# Patient Record
Sex: Female | Born: 1935 | Race: White | Hispanic: No | Smoking: Never smoker
Health system: Southern US, Community
[De-identification: ages and names within clinical notes are randomized; demographics above are authoritative.]

## PROBLEM LIST (undated history)

## (undated) DIAGNOSIS — C801 Malignant (primary) neoplasm, unspecified: Secondary | ICD-10-CM

## (undated) DIAGNOSIS — N183 Chronic kidney disease, stage 3 (moderate): Secondary | ICD-10-CM

## (undated) DIAGNOSIS — I10 Essential (primary) hypertension: Secondary | ICD-10-CM

## (undated) DIAGNOSIS — D649 Anemia, unspecified: Secondary | ICD-10-CM

## (undated) DIAGNOSIS — R55 Syncope and collapse: Principal | ICD-10-CM

## (undated) DIAGNOSIS — I421 Obstructive hypertrophic cardiomyopathy: Secondary | ICD-10-CM

## (undated) DIAGNOSIS — E079 Disorder of thyroid, unspecified: Secondary | ICD-10-CM

## (undated) HISTORY — PX: ABDOMINAL HYSTERECTOMY: SHX81

## (undated) HISTORY — PX: OTHER SURGICAL HISTORY: SHX169

## (undated) HISTORY — PX: KIDNEY SURGERY: SHX687

## (undated) HISTORY — PX: NEPHRECTOMY: SHX65

---

## 2001-02-10 ENCOUNTER — Encounter: Payer: Self-pay | Admitting: Urology

## 2001-02-10 ENCOUNTER — Ambulatory Visit (HOSPITAL_COMMUNITY): Admission: RE | Admit: 2001-02-10 | Discharge: 2001-02-10 | Payer: Self-pay | Admitting: Urology

## 2001-05-21 ENCOUNTER — Ambulatory Visit (HOSPITAL_COMMUNITY): Admission: RE | Admit: 2001-05-21 | Discharge: 2001-05-21 | Payer: Self-pay | Admitting: Endocrinology

## 2001-06-12 ENCOUNTER — Ambulatory Visit (HOSPITAL_COMMUNITY): Admission: RE | Admit: 2001-06-12 | Discharge: 2001-06-12 | Payer: Self-pay | Admitting: Surgery

## 2001-06-12 ENCOUNTER — Encounter: Payer: Self-pay | Admitting: Surgery

## 2001-06-16 ENCOUNTER — Inpatient Hospital Stay (HOSPITAL_COMMUNITY): Admission: RE | Admit: 2001-06-16 | Discharge: 2001-06-18 | Payer: Self-pay | Admitting: Surgery

## 2001-06-16 ENCOUNTER — Encounter: Payer: Self-pay | Admitting: Surgery

## 2001-07-04 ENCOUNTER — Inpatient Hospital Stay (HOSPITAL_COMMUNITY): Admission: AD | Admit: 2001-07-04 | Discharge: 2001-07-06 | Payer: Self-pay | Admitting: General Surgery

## 2001-07-29 ENCOUNTER — Encounter: Admission: RE | Admit: 2001-07-29 | Discharge: 2001-07-29 | Payer: Self-pay | Admitting: Interventional Radiology

## 2001-08-07 ENCOUNTER — Ambulatory Visit (HOSPITAL_COMMUNITY): Admission: RE | Admit: 2001-08-07 | Discharge: 2001-08-07 | Payer: Self-pay | Admitting: Interventional Radiology

## 2001-10-10 ENCOUNTER — Encounter: Payer: Self-pay | Admitting: Family Medicine

## 2001-10-10 ENCOUNTER — Ambulatory Visit (HOSPITAL_COMMUNITY): Admission: RE | Admit: 2001-10-10 | Discharge: 2001-10-10 | Payer: Self-pay | Admitting: Family Medicine

## 2001-10-23 ENCOUNTER — Encounter: Payer: Self-pay | Admitting: Family Medicine

## 2001-10-23 ENCOUNTER — Ambulatory Visit (HOSPITAL_COMMUNITY): Admission: RE | Admit: 2001-10-23 | Discharge: 2001-10-23 | Payer: Self-pay | Admitting: Family Medicine

## 2002-01-20 ENCOUNTER — Encounter: Payer: Self-pay | Admitting: Urology

## 2002-01-20 ENCOUNTER — Ambulatory Visit (HOSPITAL_COMMUNITY): Admission: RE | Admit: 2002-01-20 | Discharge: 2002-01-20 | Payer: Self-pay | Admitting: Urology

## 2002-06-26 ENCOUNTER — Ambulatory Visit (HOSPITAL_COMMUNITY): Admission: RE | Admit: 2002-06-26 | Discharge: 2002-06-26 | Payer: Self-pay | Admitting: Interventional Radiology

## 2002-08-10 ENCOUNTER — Ambulatory Visit (HOSPITAL_COMMUNITY): Admission: RE | Admit: 2002-08-10 | Discharge: 2002-08-10 | Payer: Self-pay | Admitting: Ophthalmology

## 2002-12-21 ENCOUNTER — Ambulatory Visit (HOSPITAL_COMMUNITY): Admission: RE | Admit: 2002-12-21 | Discharge: 2002-12-21 | Payer: Self-pay | Admitting: Ophthalmology

## 2003-07-09 ENCOUNTER — Ambulatory Visit (HOSPITAL_COMMUNITY): Admission: RE | Admit: 2003-07-09 | Discharge: 2003-07-09 | Payer: Self-pay | Admitting: Interventional Radiology

## 2003-07-16 ENCOUNTER — Ambulatory Visit (HOSPITAL_COMMUNITY): Admission: RE | Admit: 2003-07-16 | Discharge: 2003-07-16 | Payer: Self-pay | Admitting: Endocrinology

## 2003-11-01 ENCOUNTER — Ambulatory Visit (HOSPITAL_COMMUNITY): Admission: RE | Admit: 2003-11-01 | Discharge: 2003-11-01 | Payer: Self-pay | Admitting: Family Medicine

## 2004-03-02 ENCOUNTER — Ambulatory Visit (HOSPITAL_COMMUNITY): Admission: RE | Admit: 2004-03-02 | Discharge: 2004-03-02 | Payer: Self-pay | Admitting: Family Medicine

## 2005-07-04 ENCOUNTER — Ambulatory Visit (HOSPITAL_COMMUNITY): Admission: RE | Admit: 2005-07-04 | Discharge: 2005-07-04 | Payer: Self-pay | Admitting: Family Medicine

## 2005-10-22 ENCOUNTER — Other Ambulatory Visit: Admission: RE | Admit: 2005-10-22 | Discharge: 2005-10-22 | Payer: Self-pay | Admitting: Dermatology

## 2006-02-08 ENCOUNTER — Ambulatory Visit (HOSPITAL_COMMUNITY): Admission: RE | Admit: 2006-02-08 | Discharge: 2006-02-08 | Payer: Self-pay | Admitting: Urology

## 2006-02-20 ENCOUNTER — Ambulatory Visit (HOSPITAL_COMMUNITY): Admission: RE | Admit: 2006-02-20 | Discharge: 2006-02-20 | Payer: Self-pay | Admitting: Family Medicine

## 2006-05-02 ENCOUNTER — Ambulatory Visit: Payer: Self-pay | Admitting: Internal Medicine

## 2006-05-30 ENCOUNTER — Ambulatory Visit: Payer: Self-pay | Admitting: Internal Medicine

## 2006-05-30 LAB — CONVERTED CEMR LAB: TSH: 0.296 microintl units/mL

## 2006-06-07 ENCOUNTER — Ambulatory Visit: Payer: Self-pay | Admitting: Cardiology

## 2006-06-07 ENCOUNTER — Ambulatory Visit (HOSPITAL_COMMUNITY): Admission: RE | Admit: 2006-06-07 | Discharge: 2006-06-07 | Payer: Self-pay | Admitting: Family Medicine

## 2006-06-17 ENCOUNTER — Ambulatory Visit: Payer: Self-pay | Admitting: Internal Medicine

## 2006-06-29 ENCOUNTER — Encounter (INDEPENDENT_AMBULATORY_CARE_PROVIDER_SITE_OTHER): Payer: Self-pay | Admitting: Internal Medicine

## 2006-06-29 LAB — CONVERTED CEMR LAB: Pap Smear: NORMAL

## 2006-09-09 ENCOUNTER — Encounter: Payer: Self-pay | Admitting: Internal Medicine

## 2006-09-09 DIAGNOSIS — R011 Cardiac murmur, unspecified: Secondary | ICD-10-CM

## 2006-09-09 DIAGNOSIS — G5600 Carpal tunnel syndrome, unspecified upper limb: Secondary | ICD-10-CM | POA: Insufficient documentation

## 2006-09-09 DIAGNOSIS — I421 Obstructive hypertrophic cardiomyopathy: Secondary | ICD-10-CM | POA: Insufficient documentation

## 2006-09-09 DIAGNOSIS — J309 Allergic rhinitis, unspecified: Secondary | ICD-10-CM | POA: Insufficient documentation

## 2006-09-09 DIAGNOSIS — M545 Low back pain: Secondary | ICD-10-CM

## 2006-09-09 DIAGNOSIS — K644 Residual hemorrhoidal skin tags: Secondary | ICD-10-CM | POA: Insufficient documentation

## 2006-09-09 DIAGNOSIS — E785 Hyperlipidemia, unspecified: Secondary | ICD-10-CM

## 2006-09-09 DIAGNOSIS — N390 Urinary tract infection, site not specified: Secondary | ICD-10-CM | POA: Insufficient documentation

## 2006-09-09 DIAGNOSIS — R32 Unspecified urinary incontinence: Secondary | ICD-10-CM

## 2006-09-09 DIAGNOSIS — E039 Hypothyroidism, unspecified: Secondary | ICD-10-CM | POA: Insufficient documentation

## 2006-09-09 DIAGNOSIS — J40 Bronchitis, not specified as acute or chronic: Secondary | ICD-10-CM | POA: Insufficient documentation

## 2006-09-09 DIAGNOSIS — C569 Malignant neoplasm of unspecified ovary: Secondary | ICD-10-CM | POA: Insufficient documentation

## 2006-09-09 DIAGNOSIS — N318 Other neuromuscular dysfunction of bladder: Secondary | ICD-10-CM

## 2006-09-09 DIAGNOSIS — C649 Malignant neoplasm of unspecified kidney, except renal pelvis: Secondary | ICD-10-CM | POA: Insufficient documentation

## 2006-09-09 DIAGNOSIS — I10 Essential (primary) hypertension: Secondary | ICD-10-CM | POA: Insufficient documentation

## 2006-09-09 DIAGNOSIS — G43909 Migraine, unspecified, not intractable, without status migrainosus: Secondary | ICD-10-CM | POA: Insufficient documentation

## 2006-09-09 DIAGNOSIS — J984 Other disorders of lung: Secondary | ICD-10-CM

## 2006-09-09 HISTORY — DX: Obstructive hypertrophic cardiomyopathy: I42.1

## 2006-12-21 ENCOUNTER — Emergency Department (HOSPITAL_COMMUNITY): Admission: EM | Admit: 2006-12-21 | Discharge: 2006-12-21 | Payer: Self-pay | Admitting: Emergency Medicine

## 2008-04-10 ENCOUNTER — Ambulatory Visit: Payer: Self-pay | Admitting: Cardiology

## 2008-04-10 ENCOUNTER — Ambulatory Visit: Payer: Self-pay | Admitting: Orthopedic Surgery

## 2008-04-10 ENCOUNTER — Inpatient Hospital Stay (HOSPITAL_COMMUNITY): Admission: EM | Admit: 2008-04-10 | Discharge: 2008-04-14 | Payer: Self-pay | Admitting: Emergency Medicine

## 2008-04-12 ENCOUNTER — Encounter (INDEPENDENT_AMBULATORY_CARE_PROVIDER_SITE_OTHER): Payer: Self-pay | Admitting: Internal Medicine

## 2008-04-18 ENCOUNTER — Inpatient Hospital Stay (HOSPITAL_COMMUNITY): Admission: EM | Admit: 2008-04-18 | Discharge: 2008-04-21 | Payer: Self-pay | Admitting: Emergency Medicine

## 2008-04-29 ENCOUNTER — Emergency Department (HOSPITAL_COMMUNITY): Admission: EM | Admit: 2008-04-29 | Discharge: 2008-04-29 | Payer: Self-pay | Admitting: Emergency Medicine

## 2008-05-13 ENCOUNTER — Encounter: Payer: Self-pay | Admitting: Orthopedic Surgery

## 2008-05-20 ENCOUNTER — Ambulatory Visit: Payer: Self-pay | Admitting: Orthopedic Surgery

## 2008-05-20 DIAGNOSIS — S43086A Other dislocation of unspecified shoulder joint, initial encounter: Secondary | ICD-10-CM | POA: Insufficient documentation

## 2008-06-15 ENCOUNTER — Encounter: Payer: Self-pay | Admitting: Orthopedic Surgery

## 2008-06-15 ENCOUNTER — Ambulatory Visit (HOSPITAL_COMMUNITY): Admission: RE | Admit: 2008-06-15 | Discharge: 2008-06-15 | Payer: Self-pay | Admitting: Internal Medicine

## 2008-09-06 ENCOUNTER — Ambulatory Visit (HOSPITAL_COMMUNITY): Admission: RE | Admit: 2008-09-06 | Discharge: 2008-09-06 | Payer: Self-pay | Admitting: Internal Medicine

## 2009-03-22 ENCOUNTER — Ambulatory Visit (HOSPITAL_COMMUNITY): Admission: RE | Admit: 2009-03-22 | Discharge: 2009-03-22 | Payer: Self-pay | Admitting: Internal Medicine

## 2009-04-18 ENCOUNTER — Encounter (HOSPITAL_COMMUNITY): Admission: RE | Admit: 2009-04-18 | Discharge: 2009-06-27 | Payer: Self-pay | Admitting: General Surgery

## 2009-05-13 ENCOUNTER — Encounter (INDEPENDENT_AMBULATORY_CARE_PROVIDER_SITE_OTHER): Payer: Self-pay | Admitting: General Surgery

## 2009-05-13 ENCOUNTER — Ambulatory Visit (HOSPITAL_COMMUNITY): Admission: RE | Admit: 2009-05-13 | Discharge: 2009-05-14 | Payer: Self-pay | Admitting: General Surgery

## 2009-05-22 ENCOUNTER — Emergency Department (HOSPITAL_COMMUNITY): Admission: EM | Admit: 2009-05-22 | Discharge: 2009-05-22 | Payer: Self-pay | Admitting: Emergency Medicine

## 2009-05-25 ENCOUNTER — Ambulatory Visit (HOSPITAL_COMMUNITY): Admission: RE | Admit: 2009-05-25 | Discharge: 2009-05-25 | Payer: Self-pay | Admitting: General Surgery

## 2009-06-08 ENCOUNTER — Ambulatory Visit: Payer: Self-pay | Admitting: Hematology & Oncology

## 2009-07-12 ENCOUNTER — Ambulatory Visit (HOSPITAL_COMMUNITY): Payer: Self-pay | Admitting: Oncology

## 2009-07-12 ENCOUNTER — Encounter (HOSPITAL_COMMUNITY): Admission: RE | Admit: 2009-07-12 | Discharge: 2009-08-11 | Payer: Self-pay | Admitting: Oncology

## 2009-07-18 ENCOUNTER — Ambulatory Visit (HOSPITAL_COMMUNITY): Admission: RE | Admit: 2009-07-18 | Discharge: 2009-07-18 | Payer: Self-pay | Admitting: Oncology

## 2009-12-03 IMAGING — CT CT PELVIS W/O CM
2 of 3 series · 13 of 46 positions shown, 15 images · non-contrast
Comparison: CT chest 02/20/2006

CT CHEST

CLINICAL DATA: History clear cell renal carcinoma.  Right
nephrectomy.  Additional history of throat and uterine cancer.

CT CHEST, ABDOMEN AND PELVIS WITHOUT CONTRAST
TECHNIQUE: Multidetector CT imaging of the chest, abdomen and
pelvis was performed following the standard protocol without IV
contrast.
The patient has an IV  contrast allergy.  The patient refused
premedication and therefore scan was done without IV contrast.
Oral contrast was administered.

[Series 2: cap w/o 5.0 b40f · axial · non-contrast · 0.69mm/px · z∈[-666,-116]mm · 10 of 128 slices shown, 12 images]
[im 9/128  soft-tissue]
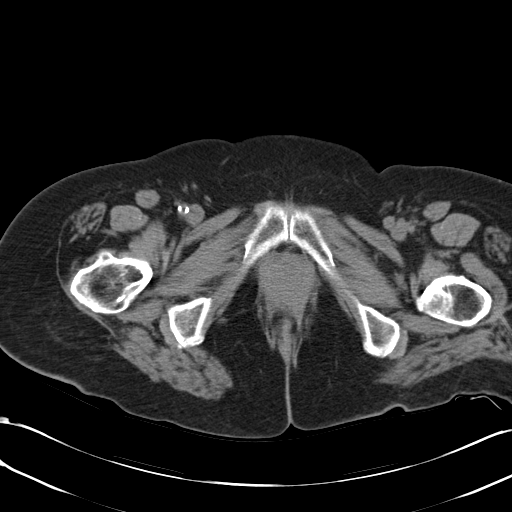
[im 9/128  bone]
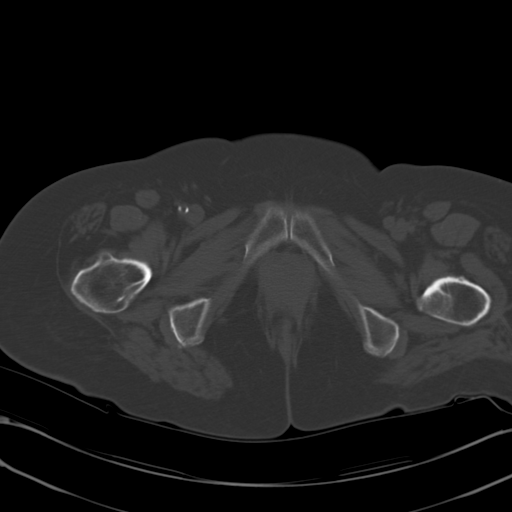
[im 21/128  soft-tissue]
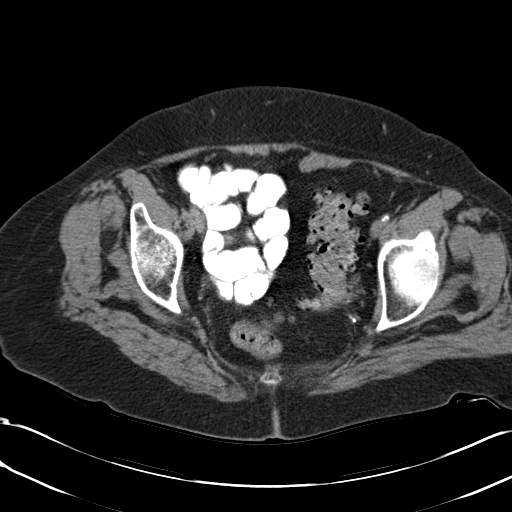
[im 33/128  soft-tissue]
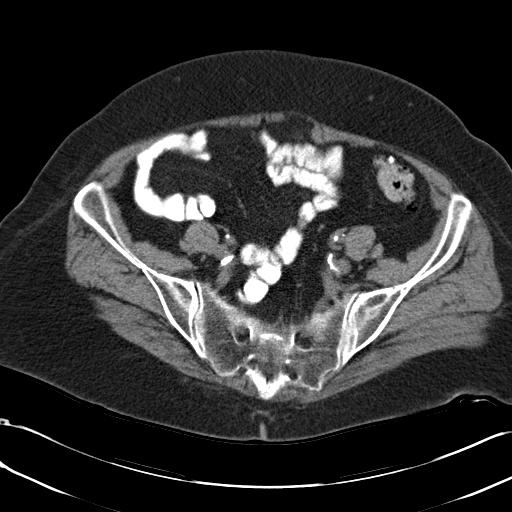
[im 46/128  soft-tissue]
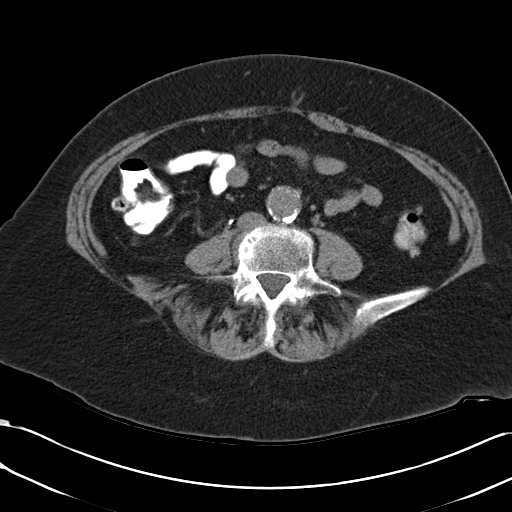
[im 58/128  soft-tissue]
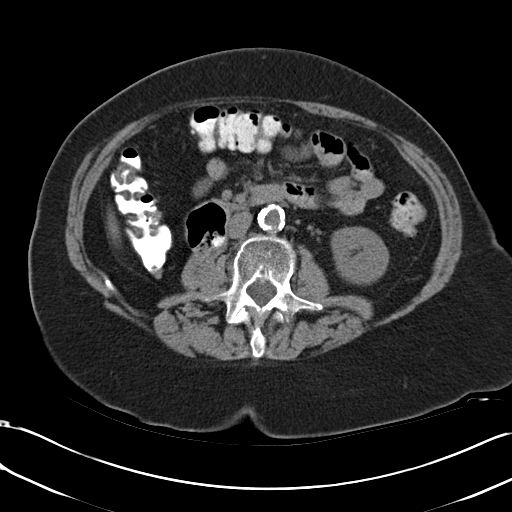
[im 70/128  soft-tissue]
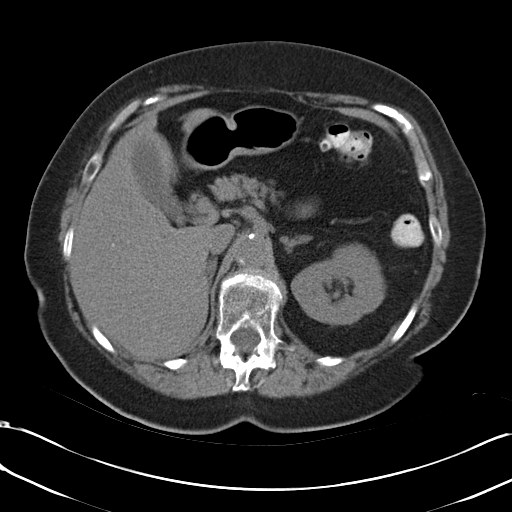
[im 82/128  soft-tissue]
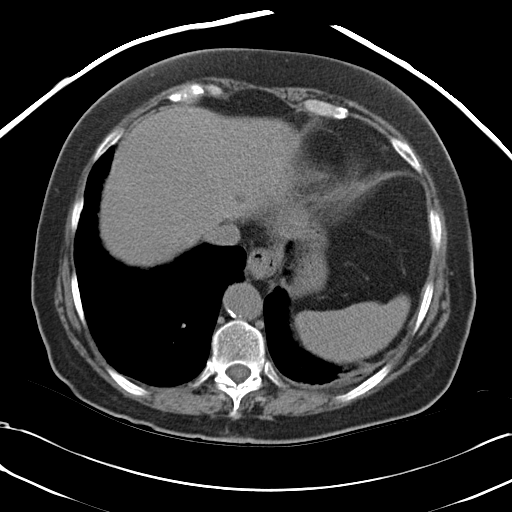
[im 95/128  soft-tissue]
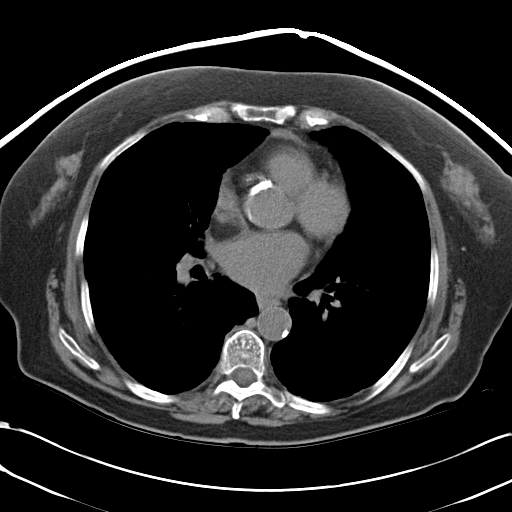
[im 107/128  soft-tissue]
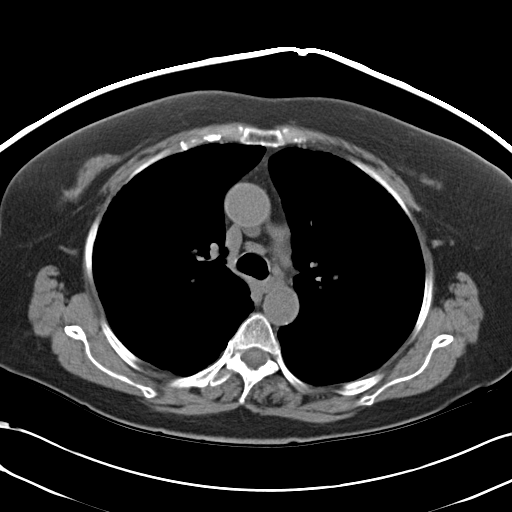
[im 107/128  bone]
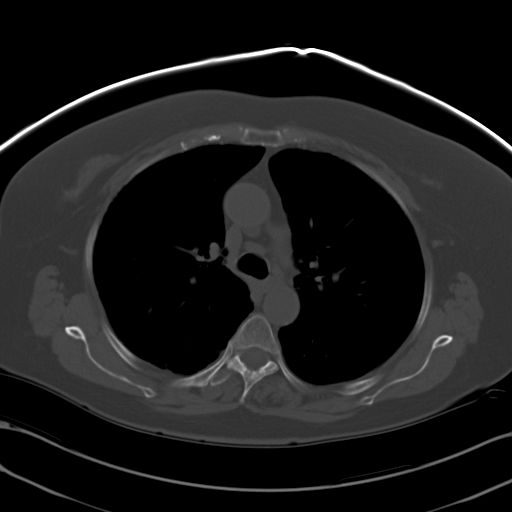
[im 119/128  soft-tissue]
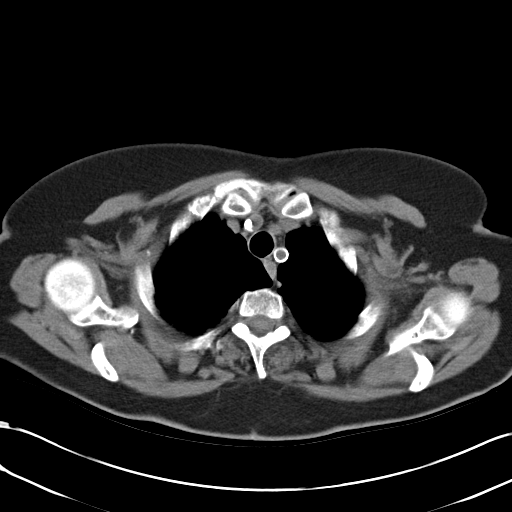

[Series 4: mpr coro cap (id) · coronal · 0.64mm/px · 3 of 78 slices shown]
[im 26/78  soft-tissue]
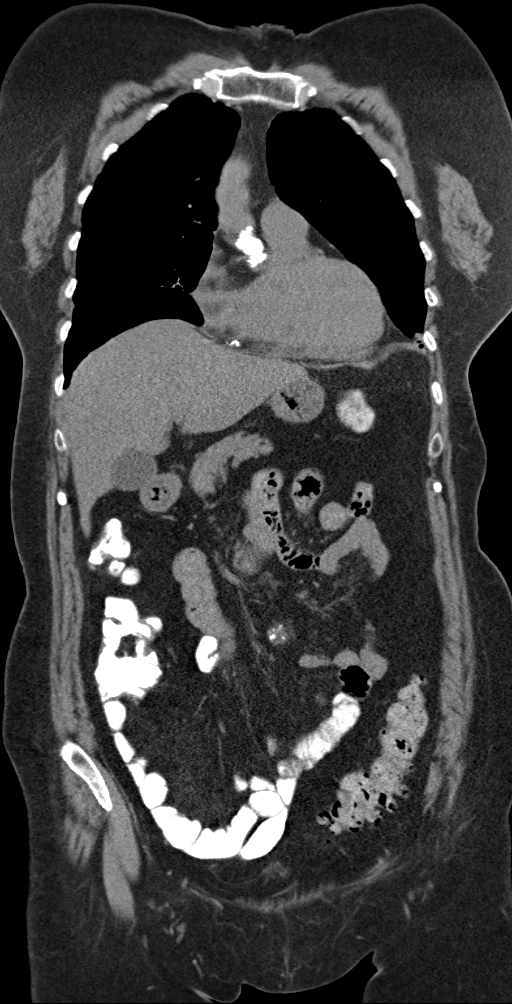
[im 35/78  soft-tissue]
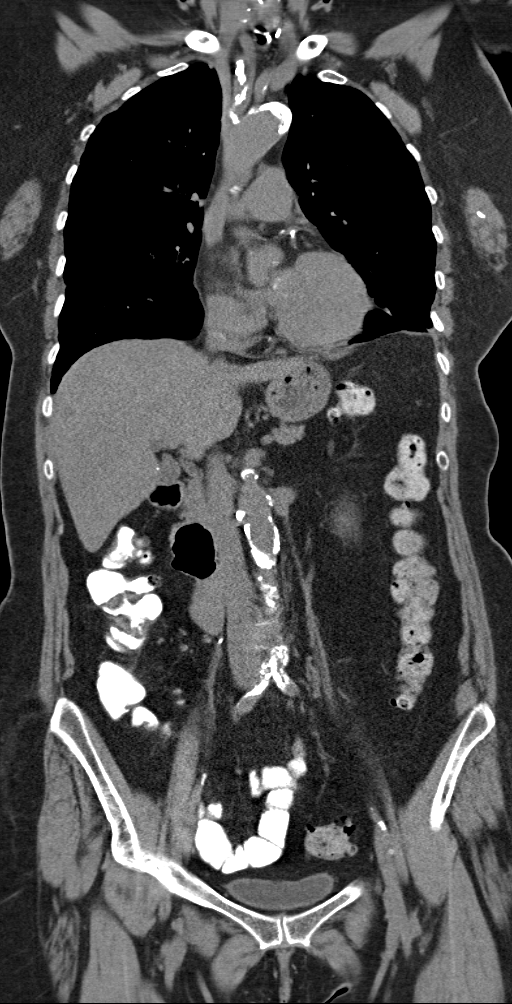
[im 43/78  soft-tissue]
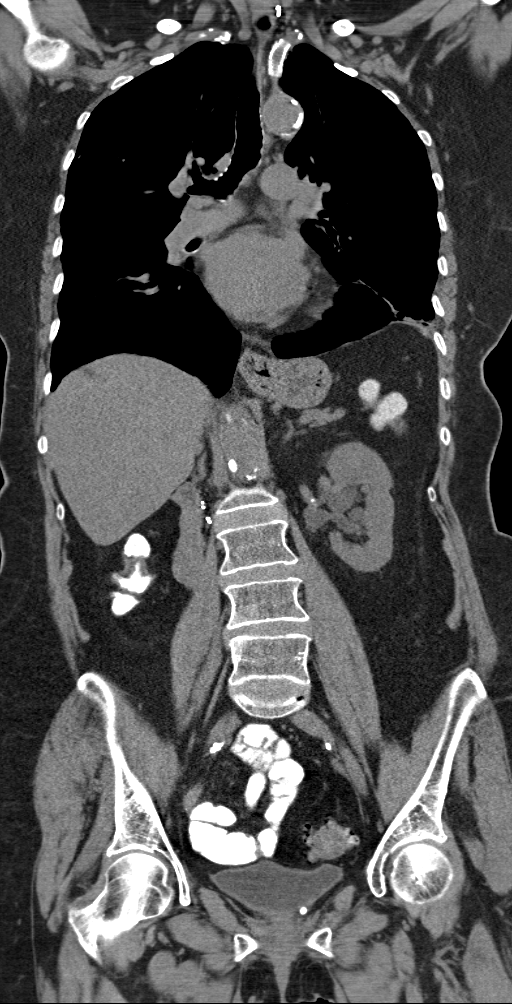

[13 of 46 positions shown; findings below may reference images not displayed]

FINDINGS: No chest wall abnormality.  No evidence of axillary or
supraclavicular lymphadenopathy.  No evidence of mediastinal or
hilar lymphadenopathy.  No evidence of pericardial effusion.

Review of the lung parenchyma again demonstrates multiple bilateral
small peripheral nodules in branching patterns.  The degree of
nodularity has increased compared to prior and there are several
new nodules including 9 mm right upper lobe pulmonary nodule (
image 28) and a 9 mm right upper pulmonary nodule ( image 18 ).
The airways appear normal.
IMPRESSION: Interval increase in the diffuse nodular pattern with several new
pulmonary nodules.  Favor this to represent chronic
infectious/inflammatory process; however, given that several of
these nodules are new from 1222 this warrants follow-up.  These
nodules are likely too small to biopsy or assess by FDG PET
imaging, therefore recommend short-term follow-up with noncontrast
CT of thorax in 3 -6 months.

CT ABDOMEN
FINDINGS: Low density cyst in the lateral left hepatic lobe which
is unchanged from prior.  A smaller cyst in the more medial left
hepatic lobe (image 47) is also unchanged.  There is several
granulomas within the liver.  The gallbladder, pancreas, and spleen
appear normal.  The adrenal glands appear normal.

There is a prior right nephrectomy.  No nodularity in the
nephrectomy bed.  The left kidney appears normal.  No evidence of
mass on this noncontrast exam.

The stomach, small bowel, and cecum appear normal.  The colon
appears normal.

Abdominal aorta is normal caliber.  No evidence of retroperitoneal
lymphadenopathy.
IMPRESSION: 1. No evidence of renal cell carcinoma recurrence in the right
nephrectomy bed.
2.  No evidence of renal mass involving the left kidney
acknowledging the limitations of this noncontrast exam.

CT PELVIS
FINDINGS: The bladder appears normal.  Post hysterectomy anatomy.
The rectum appears normal.  There is extensive diverticular disease
of the sigmoid colon without evidence of acute inflammation.

No evidence of pelvic lymphadenopathy. Review of  bone windows
demonstrates no aggressive osseous lesions.
IMPRESSION: 1.  No evidence of metastasis within the soft tissues of the
pelvis.
2.  No evidence of skeletal metastasis.
3. Diverticulosis without evidence of acute diverticulitis.

## 2010-03-21 ENCOUNTER — Ambulatory Visit (HOSPITAL_COMMUNITY): Payer: Self-pay | Admitting: Oncology

## 2010-03-21 ENCOUNTER — Encounter (HOSPITAL_COMMUNITY): Admission: RE | Admit: 2010-03-21 | Discharge: 2010-04-20 | Payer: Self-pay | Admitting: Oncology

## 2010-09-04 ENCOUNTER — Ambulatory Visit (HOSPITAL_COMMUNITY): Payer: Self-pay | Admitting: Oncology

## 2010-09-04 ENCOUNTER — Encounter (HOSPITAL_COMMUNITY): Admission: RE | Admit: 2010-09-04 | Payer: Self-pay | Admitting: Oncology

## 2010-12-10 LAB — COMPREHENSIVE METABOLIC PANEL
ALT: 15 U/L (ref 0–35)
AST: 18 U/L (ref 0–37)
Albumin: 4 g/dL (ref 3.5–5.2)
CO2: 28 mEq/L (ref 19–32)
Chloride: 102 mEq/L (ref 96–112)
Creatinine, Ser: 1.11 mg/dL (ref 0.4–1.2)
GFR calc Af Amer: 57 mL/min — ABNORMAL LOW (ref >60)
Sodium: 136 mEq/L (ref 135–145)
Total Bilirubin: 0.5 mg/dL (ref 0.3–1.2)

## 2010-12-10 LAB — CBC
Hemoglobin: 14 g/dL (ref 12.0–15.0)
MCH: 31.6 pg (ref 26.0–34.0)
Platelets: 293 10*3/uL (ref 150–400)
RBC: 4.43 MIL/uL (ref 3.87–5.11)
WBC: 7.1 10*3/uL (ref 4.0–10.5)

## 2010-12-28 LAB — COMPREHENSIVE METABOLIC PANEL
Albumin: 4 g/dL (ref 3.5–5.2)
BUN: 18 mg/dL (ref 6–23)
Calcium: 9.5 mg/dL (ref 8.4–10.5)
Glucose, Bld: 100 mg/dL — ABNORMAL HIGH (ref 70–99)
Sodium: 138 mEq/L (ref 135–145)
Total Protein: 7.5 g/dL (ref 6.0–8.3)

## 2010-12-28 LAB — CBC
HCT: 41.2 % (ref 36.0–46.0)
Hemoglobin: 14.1 g/dL (ref 12.0–15.0)
MCHC: 34.3 g/dL (ref 30.0–36.0)
Platelets: 316 10*3/uL (ref 150–400)
RDW: 13.7 % (ref 11.5–15.5)

## 2010-12-28 LAB — DIFFERENTIAL
Basophils Relative: 0 % (ref 0–1)
Lymphocytes Relative: 24 % (ref 12–46)
Lymphs Abs: 1.6 10*3/uL (ref 0.7–4.0)
Monocytes Absolute: 0.4 10*3/uL (ref 0.1–1.0)
Monocytes Relative: 6 % (ref 3–12)
Neutro Abs: 4.4 10*3/uL (ref 1.7–7.7)

## 2010-12-30 LAB — DIFFERENTIAL
Basophils Absolute: 0 10*3/uL (ref 0.0–0.1)
Basophils Absolute: 0 10*3/uL (ref 0.0–0.1)
Basophils Relative: 0 % (ref 0–1)
Eosinophils Relative: 3 % (ref 0–5)
Eosinophils Relative: 4 % (ref 0–5)
Lymphocytes Relative: 30 % (ref 12–46)
Lymphs Abs: 2.4 10*3/uL (ref 0.7–4.0)
Monocytes Absolute: 0.5 10*3/uL (ref 0.1–1.0)
Monocytes Absolute: 0.6 10*3/uL (ref 0.1–1.0)
Monocytes Relative: 7 % (ref 3–12)
Monocytes Relative: 8 % (ref 3–12)
Neutro Abs: 4.7 10*3/uL (ref 1.7–7.7)

## 2010-12-30 LAB — BASIC METABOLIC PANEL
CO2: 28 mEq/L (ref 19–32)
Calcium: 9.7 mg/dL (ref 8.4–10.5)
Chloride: 102 mEq/L (ref 96–112)
GFR calc Af Amer: >60 mL/min (ref >60)
GFR calc non Af Amer: 50 mL/min — ABNORMAL LOW (ref >60)
Glucose, Bld: 102 mg/dL — ABNORMAL HIGH (ref 70–99)
Glucose, Bld: 130 mg/dL — ABNORMAL HIGH (ref 70–99)
Potassium: 4.4 mEq/L (ref 3.5–5.1)
Potassium: 4.4 mEq/L (ref 3.5–5.1)
Sodium: 136 mEq/L (ref 135–145)
Sodium: 136 mEq/L (ref 135–145)

## 2010-12-30 LAB — CBC
HCT: 36.6 % (ref 36.0–46.0)
HCT: 41.9 % (ref 36.0–46.0)
Hemoglobin: 12.9 g/dL (ref 12.0–15.0)
Hemoglobin: 14.5 g/dL (ref 12.0–15.0)
MCHC: 35.2 g/dL (ref 30.0–36.0)
MCV: 91.8 fL (ref 78.0–100.0)
RBC: 4.54 MIL/uL (ref 3.87–5.11)
RDW: 13.2 % (ref 11.5–15.5)
RDW: 13.9 % (ref 11.5–15.5)

## 2010-12-30 LAB — URINALYSIS, ROUTINE W REFLEX MICROSCOPIC
Bilirubin Urine: NEGATIVE
Hgb urine dipstick: NEGATIVE
Ketones, ur: NEGATIVE mg/dL
Nitrite: NEGATIVE
Urobilinogen, UA: 0.2 mg/dL (ref 0.0–1.0)
pH: 7 (ref 5.0–8.0)

## 2010-12-30 LAB — URINE MICROSCOPIC-ADD ON

## 2010-12-31 LAB — THYROGLOBULIN LEVEL: Antithyroglobulin Ab: <20 IU/mL (ref <20)

## 2011-01-25 ENCOUNTER — Emergency Department (HOSPITAL_COMMUNITY): Payer: Medicare Other

## 2011-01-25 ENCOUNTER — Emergency Department (HOSPITAL_COMMUNITY)
Admission: EM | Admit: 2011-01-25 | Discharge: 2011-01-26 | Disposition: A | Discharge status: Home / Self Care | Payer: Medicare Other | Attending: Emergency Medicine | Admitting: Emergency Medicine

## 2011-01-25 DIAGNOSIS — I10 Essential (primary) hypertension: Secondary | ICD-10-CM | POA: Insufficient documentation

## 2011-01-25 DIAGNOSIS — K625 Hemorrhage of anus and rectum: Secondary | ICD-10-CM | POA: Insufficient documentation

## 2011-01-25 DIAGNOSIS — R109 Unspecified abdominal pain: Secondary | ICD-10-CM | POA: Insufficient documentation

## 2011-01-25 DIAGNOSIS — Z79899 Other long term (current) drug therapy: Secondary | ICD-10-CM | POA: Insufficient documentation

## 2011-01-25 LAB — URINALYSIS, ROUTINE W REFLEX MICROSCOPIC
Bilirubin Urine: NEGATIVE
Glucose, UA: NEGATIVE mg/dL
Ketones, ur: NEGATIVE mg/dL
Nitrite: NEGATIVE
Specific Gravity, Urine: 1.015 (ref 1.005–1.030)
pH: 5.5 (ref 5.0–8.0)

## 2011-01-25 LAB — CBC
HCT: 38.5 % (ref 36.0–46.0)
Hemoglobin: 12.8 g/dL (ref 12.0–15.0)
MCH: 31 pg (ref 26.0–34.0)
MCV: 93.2 fL (ref 78.0–100.0)
Platelets: 318 10*3/uL (ref 150–400)
RBC: 4.13 MIL/uL (ref 3.87–5.11)
WBC: 14.4 10*3/uL — ABNORMAL HIGH (ref 4.0–10.5)

## 2011-01-25 LAB — DIFFERENTIAL
Eosinophils Absolute: 0.1 10*3/uL (ref 0.0–0.7)
Lymphocytes Relative: 11 % — ABNORMAL LOW (ref 12–46)
Lymphs Abs: 1.6 10*3/uL (ref 0.7–4.0)
Monocytes Relative: 9 % (ref 3–12)
Neutro Abs: 11.5 10*3/uL — ABNORMAL HIGH (ref 1.7–7.7)
Neutrophils Relative %: 80 % — ABNORMAL HIGH (ref 43–77)

## 2011-01-25 LAB — URINE MICROSCOPIC-ADD ON

## 2011-01-25 LAB — PROTIME-INR: INR: 1.06 (ref 0.00–1.49)

## 2011-01-26 LAB — BASIC METABOLIC PANEL
BUN: 22 mg/dL (ref 6–23)
CO2: 25 mEq/L (ref 19–32)
Calcium: 9 mg/dL (ref 8.4–10.5)
Chloride: 99 mEq/L (ref 96–112)
Creatinine, Ser: 1.17 mg/dL (ref 0.4–1.2)

## 2011-01-27 LAB — URINE CULTURE

## 2011-02-06 NOTE — Group Therapy Note (Signed)
NAME:  Carolyn Hughes, Carolyn Hughes              ACCOUNT NO.:  [id number]   MEDICAL RECORD NO.:  [id number]          PATIENT TYPE:  INP   LOCATION:  IC06                          FACILITY:  [facility]   PHYSICIAN:  Catalina Pizza, M.D.        DATE OF BIRTH:  1929/02/24   DATE OF PROCEDURE:  DATE OF DISCHARGE:                                 PROGRESS NOTE   SUBJECTIVE:  Ms. Outten is a pleasant 77 year old white female who had  episode of syncope of unknown cause and fell and had a dislocation of  her left shoulder.  As far as pain wise from her left shoulder, she  appears to be doing well.  She does mentions having some bruising on her  left upper arm related to this, but denies any problems with shortness  breath.  No problems with chest pain.  Still on telemetry with no  significant adverse arrhythmias noted.   OBJECTIVE:  VITAL SIGNS:  Temperature is afebrile, blood pressure on  exam this morning revealed a blood pressure of 93/53 with pulse of 82,  respiratory rate 16, and sating 99% on room air.  GENERAL:  This is an elderly female, sitting on the side of the bed,  eating breakfast, in no acute distress.  HEENT:  Unremarkable.  LUNGS:  Few crackles at bases bilaterally when sitting up.  CARDIOVASCULAR:  Regular rate and rhythm with 2/6 to 3/6 systolic  murmur, best appreciated at the left upper sternal border.  ABDOMEN:  Soft, nontender, and nondistended.  Positive bowel sounds.  EXTREMITIES:  No lower extremity edema.  NEUROLOGIC:  Alert and oriented x3.  No deficits noted.   LABORATORY DATA:  White count was 7.5, hemoglobin 11.5, and platelet  count 268.  BMET showed sodium 141, potassium 3.7, chloride 109, CO2 26,  glucose 104, BUN 17, creatinine 1.10, and calcium of 8.7.  Cardiac panel  showed a mildly elevated CK of 273, MB of 2.7, and troponin-I of 0.03.  TSH and free T4 are still pending.   IMPRESSION:  This is a 77 year old white female with episode of syncope  and dislocation of her left  shoulder with resulting fracture.   ASSESSMENT/PLAN:  1. Syncope, unclear etiology.  Awaiting full cardiac workup.      Apparently, she has not had any routine followup with Cardiology,      but does have history of hypertrophic obstructive cardiomyopathy in      looking back at her history, which was unknown until this event.      The patient was not having any heart problems before.  She does      have a murmur, which she states she has had for a prolonged period      of time, and always told that there was nothing to do about it.      Awaiting full cardiology consult, 2-D echo, and carotid Dopplers      just to make sure no significant blockages or any other heart      arrhythmia abnormalities, may be able to get some of this workup as  an outpatient.  2. Left shoulder dislocation and Hill-Sachs Bankart fracture.  Follow      up with Dr. Romeo Apple if needed, but he has suggested keeping her in      the splint for 7-10 days and then doing rehab beyond that.  Will      need to follow up with him as an outpatient.  3. Hypothyroidism.  Awaiting TSH and free T4 for further workup.  4. Hypotension.  She does have low blood pressure at this time, was      previously on Avalide, and does not exhibit any low blood pressure,      so question whether orthostasis may have been a cause of some      issues, but she has been off her medicine for approximately 2-3      days now, still having lower blood pressures, and question whether      it is related to pain medicine as well.   DISPOSITION:  If all workup from cardiology standpoint is negative, the  patient maybe able to be discharged home as soon as possible with  routine followup with the orthopedist related to her left shoulder pain  obtaining routine pain management.      Catalina Pizza, M.D.  Electronically Signed     ZH/MEDQ  D:  04/12/2008  T:  04/13/2008  Job:  [id number]

## 2011-02-06 NOTE — Group Therapy Note (Signed)
NAME:  CURSTIN, SCHMALE              ACCOUNT NO.:  [id number]   MEDICAL RECORD NO.:  [id number]          PATIENT TYPE:  INP   LOCATION:  A331                          FACILITY:  [facility]   PHYSICIAN:  Catalina Pizza, M.D.        DATE OF BIRTH:  October 09, 1928   DATE OF PROCEDURE:  DATE OF DISCHARGE:                                 PROGRESS NOTE   SUBJECTIVE:  Ms. Soller is a 77 year old white female who was just  discharged from hospital on April 14, 2008, came back in with worsening  breathing and wheezing, found to have right lower lobe pneumonia.  She  was having some fever and chills at that time.  She also states that she  had some diarrhea starting yesterday and has caused significant rawness  in the rectal area.  She also reports everything that she eats or  drinks, makes her little nauseous, although she has not had any vomiting  episodes.  She denies any specific chest pain.  She states her breathing  has improved.   OBJECTIVE:  VITAL SIGNS:  Temperature is 97.9, blood pressure 152/53,  pulse 59, respirations 20, and sating 97% on room air.  GENERAL:  This is an elderly white female, lying in bed, in no acute  distress.  HEENT:  Unremarkable.  Pupils equal, round, and reactive to light and  accommodation.  LUNGS:  Some crackles at the bases bilaterally.  Did not appreciate  wheezing at this time.  ABDOMEN:  Soft, nontender, nondistended, and positive bowel sounds.  EXTREMITIES:  No lower extremity edema noted.  NEUROLOGIC:  Alert and oriented x3.  No deficits noted.   LABORATORY DATA:  She had a vanco trough, which is 18.1.  BMET shows  sodium 139, potassium 4.3, chloride 108, CO2 24, glucose 91, BUN 11,  creatinine 1.04, and calcium of 8.2.  CBC showed white count of 7.5,  hemoglobin 11.8, and platelet count of 320.   IMPRESSION:  This is a 77 year old white female with multiple medical  problems who presents with right lower lobe pneumonia, question hospital  acquired.   ASSESSMENT  AND PLAN:  1. Right lower lobe pneumonia.  Unclear whether this was hospital      acquired or not.  The patient started to have some cough when she      was in the hospital last time, did not exhibit any fever.  She was      at home and had worsening problems, and given the fact she was in      the hospital recently, we will continue with vancomycin, as well as      the Levaquin.  She is afebrile right now, and states her breathing      has improved.  2. Left shoulder pain.  She continued to have left shoulder pain, does      not have a sling on at this time.  She did have dislocation of this      with a Hill-Sachs Bankart fracture back at last hospitalization and      will need to treat with pain  as well as splinting.  She is to limit      her motion with this.  She does have significant ecchymosis which      has been there on the left arm.  3. Nausea.  We will treat with Zofran.  4. Diarrhea.  She has had several episodes of diarrhea for last day,      unclear whether this is related to antibiotic or whether this is      related to illness, and will treat symptomatically with her rectal      irritation.  We will give Anusol-HC cream twice a day.   DISPOSITION:  The patient will likely need several days hospitalization,  and get vancomycin, and will plan by a year as far as her ability to get  back to normal.      Catalina Pizza, M.D.  Electronically Signed     ZH/MEDQ  D:  04/19/2008  T:  04/19/2008  Job:  [id number]

## 2011-02-06 NOTE — Consult Note (Signed)
NAME:  Carolyn Hughes, Carolyn Hughes              ACCOUNT NO.:  [id number]   MEDICAL RECORD NO.:  [id number]          PATIENT TYPE:  INP   LOCATION:  IC06                          FACILITY:  [facility]   PHYSICIAN:  Gerrit Friends. Dietrich Pates, MD, FACCDATE OF BIRTH:  1929-06-24   DATE OF CONSULTATION:  04/12/2008  DATE OF DISCHARGE:  04/14/2008                                 CONSULTATION   REFERRING PHYSICIAN:  Catalina Pizza, MD   HISTORY OF PRESENT ILLNESS:  A 77 year old woman with history of  hypertrophic cardiomyopathy, admitted for syncope.  Ms. Raney suffered a  sudden loss of consciousness resulting in a fall and left shoulder  dislocation the day of admission.  She had a very brief appreciation of  dizziness prior to loss of consciousness.  The duration appear to be  brief subjectively.  There was no oral injury or loss of bowel or  bladder control.  She felt normal immediately after regaining  consciousness except for the pain in her shoulder.  She describes a  similar episode in 1996 when she also fell as a result of sudden loss of  consciousness.  On that occasion, she suffered a fracture of her left  leg.  An echocardiogram was performed in September 2007.  That showed a  small left ventricular chamber with moderate-to-marked hypertrophy,  asymmetric septal hypertrophy, and a left ventricular outflow tract  gradient with a peak of 60 mmHg.   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  She has a history of vascular disease, having  undergone stenting of a left subclavian stenosis.   She has previously undergone appendectomy, total abdominal hysterectomy  and bilateral oophorectomy for neoplastic disease, and a right  nephrectomy due to renal carcinoma.   MEDICATIONS ON ADMISSION:  1. Avalide 150/12.5 mg daily.  2. Levothyroxine 0.1 mg daily.  3. Calcium 600 mg daily.  4. Aspirin 81 mg daily.   ALLERGIES:  She reports allergies or drug  sensitivities to PENICILLIN,  CODEINE, and DILAUDID.   SOCIAL HISTORY:  Widowed; lives alone locally; no children; no use of  tobacco or alcohol.   FAMILY HISTORY:  No prominent coronary artery disease.  She has no  family members who suffered sudden death.  There is no family history of  cardiac disease that sounds like hypertrophic cardiomyopathy.   REVIEW OF SYSTEMS:  She has been told of a heart murmur for many years.  She occasionally notes headaches.  She has no restrictions on her diet.  She reports some urinary frequency.  She requires corrective lenses for  near vision.  She has middle of the night awakening for which she does  not use pharmacologic therapy.  All other systems reviewed and are  negative.   PHYSICAL EXAMINATION:  GENERAL:  A pleasant woman, in no acute distress.  VITAL SIGNS:  The heart rate is 70 and regular, blood pressure 130/50,  and respirations 18.  Afebrile.  HEENT:  Anicteric sclerae; normal lids and conjunctivae; normal oral  mucosa.  NECK:  Mild jugular venous distention; normal carotid  upstrokes without  bruits.  ENDOCRINE:  Transverse eschar at the base of the neck.  LUNGS:  Few coarse bibasilar rales.  CARDIAC:  Normal first and second heart sounds; grade 3/6 harsh systolic  ejection murmur, most prominent at the cardiac base, but radiating  widely across the precordium.  ABDOMEN:  Soft and nontender; no masses; no organomegaly.  EXTREMITIES:  No edema; distal pulses intact.  NEUROLOGIC:  Symmetric strength and tone; normal cranial nerves.  SKIN:  No significant lesions.   Telemetry:  Few PVCs; no significant arrhythmias.   EKG:  Normal sinus rhythm; left atrial enlargement; right bundle-branch  block; QT prolongation; prominent lateral Q-waves nondiagnostic for  previous myocardial infarction; LVH.  Chest x-ray:  Chronic interstitial  lung markings somewhat more pronounced compared with a prior film of 2  years earlier; appears more  fibrotic than acute.  Left shoulder  dislocation.   Other laboratory notable for a glucose of 158, CPK of 273 with a normal  MB and troponin, normal CBC and otherwise, normal chemistry profile.   IMPRESSION:  Ms. Patella presents with 2 widely separated episodes of  syncope with injury and with minimal, if any, premonitory symptoms.  She  was involved in minimal effort prior to the current event.  She does not  recall extreme exertion with the initial event.  She has known  hypertrophic cardiomyopathy, which certainly could be related to these  occurrences, but I would expect more frequent symptoms if this is in  fact the cause unless there has been progression of disease.  She will  be monitored over the next few days.  Myocardial infarction will be  ruled out.  We will seek other causes such as pulmonary embolism, but I  doubt we will find any important underlying acute event.  Echocardiogram  will be repeated.  Orthostatic vital signs will be determined.  We will  treat her empirically with beta-blockers.  Plans can be made for  discharge within the next 1 or 2 days with subsequent outpatient event  recording and close cardiology followup.  We greatly appreciate the  opportunity to see this interesting woman, and we will be happy to  provide her with cardiology care.      Gerrit Friends. Dietrich Pates, MD, [facility]  Electronically Signed     RMR/MEDQ  D:  04/14/2008  T:  04/14/2008  Job:  [id number]

## 2011-02-06 NOTE — Group Therapy Note (Signed)
NAME:  Carolyn Hughes, Carolyn Hughes              ACCOUNT NO.:  [id number]   MEDICAL RECORD NO.:  [id number]          PATIENT TYPE:  INP   LOCATION:  IC06                          FACILITY:  [facility]   PHYSICIAN:  Catalina Pizza, M.D.        DATE OF BIRTH:  1928/12/31   DATE OF PROCEDURE:  04/13/2008  DATE OF DISCHARGE:                                 PROGRESS NOTE   SUBJECTIVE:  Carolyn Hughes is a pleasant 77 year old white female who had an  episode of question syncope, unknown cause, fell, and had dislocation  and small fracture of the left shoulder related to dislocation, and  apparently pain is doing much better that she is sitting up on fiberbed.  She denies any significant chest pain.  No problems with her breathing.  She was seen and evaluated by cardiology.  She states that she is ready  to go home at this time.   OBJECTIVE:  VITAL SIGNS:  The patient is afebrile.  Blood pressure is  115/68 with pulse of 70, respiratory rate 16, and sating 99% on room  air.  GENERAL:  This is an elderly female lying on fiberbed (no acute  distress).  HEENT:  Unremarkable.  LUNGS:  Few crackles at bases bilaterally.  CARDIOVASCULAR:  Regular rate and rhythm with 3/6 systolic murmur heard  in all cardiac fields.  ABDOMEN:  Soft, nontender, and nondistended.  Positive bowel sounds.  EXTREMITIES:  No extremity edema.  NEUROLOGIC:  Alert and oriented x3.  No deficits noted.   LABORATORY DATA:  TSH 0.385, free T4 is 1.49.  A 2-D echo results reveal  left ventricular systolic function was relatively normal, does have a  gradient of approximately 26 mmHg.  She essentially has hypertrophic  obstructive cardiomyopathy, again noted.   IMPRESSION:  A 77 year old white female with episode of syncope and  dislocation over the left shoulder with resulting fracture.   ASSESSMENT AND PLAN:  1. Syncope.  No further issues apparent at this time.  No      irregularities on telemetry thus far.  She did have a 2-D echo and  carotid Dopplers which revealed some atherosclerotic plaques      significant in the left and right carotid, unclear whether this had      anything to do with syncope or not.  She does have HOCM as well      which may be slightly worse than it was 2-3 years ago, but is not a      significant issue per cardiology.  The patient was started on beta-      blocker as well to help slow the heart rate down, to help out some      with this.  2. Left shoulder dislocation and Hill-Sachs Bankart fracture.  She was      seen by Dr. Romeo Apple initially and considered immobilization for 7-      10 days and then will need physical therapy beyond that.  3. Hypothyroidism.  Her TSH was on the low side.  We will decrease her      dose to  88 mcg daily and will need to recheck in 1 month.  4. Hypotension.  Her blood pressure appears to be a little bit better      at this time and will need to continue to monitor this to make sure      she is not having significant lows which could cause orthostasis      and risk of fall.  5. Hypertrophic obstructive cardiomyopathy.  We will again follow up      with cardiology for this to see if there may need an event monitor      as an outpatient.  6. Carotid arterial disease.  She has approximately 50%-69% blockage      in her carotids bilaterally.  Not a significant blockage enough for      surgery, but we will need to reassess on every 6 months to a year      basis to ensure it is not significantly worsening.   DISPOSITION:  The patient will likely be discharged in the morning after  physical therapy works with the patient and will need to follow up with  cardiology and orthopedic physician.      Catalina Pizza, M.D.  Electronically Signed     ZH/MEDQ  D:  04/13/2008  T:  04/14/2008  Job:  [id number]

## 2011-02-06 NOTE — H&P (Signed)
NAME:  Carolyn Hughes, Carolyn Hughes              ACCOUNT NO.:  [id number]   MEDICAL RECORD NO.:  [id number]          PATIENT TYPE:  INP   LOCATION:  IC06                          FACILITY:  [facility]   PHYSICIAN:  Catalina Pizza, M.D.        DATE OF BIRTH:  01/01/1929   DATE OF ADMISSION:  04/10/2008  DATE OF DISCHARGE:  LH                              HISTORY & PHYSICAL   CHIEF COMPLAINT:  Left shoulder pain, and passed out.   HISTORY OF PRESENT ILLNESS:  Carolyn Hughes is a pleasant 77 year old white  female with extensive surgical history, as noted below, who was in her  usual state of health, doing well, and had just returned back from the  emergency department, where she was checking on her sister-in-law who  apparently had a stroke, when she walked in her house and had a spell  where she essentially had acute dizziness and weakness, feels that she  blacked out, and hit the floor.  She states that as soon as she hit the  floor, though, she was alert and awake, and noted that she had pain in  her left shoulder, and she crawled over to the phone, where she called  one of her friends, who then called [id number], and she was brought to the  emergency department for further assessment.  She was seen and assessed  by the emergency room physician, unable to set her dislocation.  Did  have also some small fractures noted, secondary to the dislocation, and  Dr. Romeo Apple was consulted and seen.  The patient did not note any other  abnormal symptoms.  Denied any chest pain.  No problems with her  breathing.  Had had normal bowel movements and urination.  No other  difficulties recently.  Apparently, Carolyn Hughes had an episode back in  1996 of a similar event, when she states she had a similar blackout-type  episode and fell and broke her left leg and had to be in a cast for 3  months from this.  They never figured out exactly what caused that  syncope episode either.   PAST MEDICAL HISTORY:  1. Appendectomy.  2. She had  a total abdominal hysterectomy with bilateral oophorectomy      done by Dr. Leodis Rains back in the 1970s secondary to some type of      endometrial or uterine cancer.  3. She had a right nephrectomy secondary to kidney cancer by Dr.      Rito Ehrlich.  4. 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  5. Questionable history of hypertrophic obstructive cardiomyopathy on      a 2D echocardiogram done back in 2007.   ALLERGIES:  1. PENICILLIN.  2. Apparent sensitivity to DILAUDID, with nausea and vomiting.  3. CODEINE.   MEDICATIONS:  1. Avalide 150/12.5.  2. Synthroid 100 mcg p.o. daily.  3. Calcium 600 mg p.o. daily.  4. Aspirin 81 mg p.o. daily.   SOCIAL HISTORY:  Carolyn Hughes has been widowed x2.  She lives by  herself  and does not have any children.  No alcohol or tobacco use.   FAMILY HISTORY:  Noncontributory.   REVIEW OF SYSTEMS:  The patient denies any chest pain.  No shortness of  breath.  No problems with weakness or dizziness at this time.  No  urinary complaints.  No problems with abdominal pains.  Denies any  neurologic deficits.  No problems with vision.  No difficulty with  moving extremities up until this issue. where she has pain in her left  shoulder.   PHYSICAL EXAMINATION:  VITAL SIGNS:  At this time show a temperature of  97.5, blood pressure 132/57, pulse 77, saturating 99% on room air.  GENERAL:  This is an elderly white female, lying in bed, in no acute  distress.  HEENT:  Unremarkable.  Pupils equal, round, and reactive to light and  accommodation.  Does have bilateral cataract replacement.  No scleral  icterus.  NECK:  Supple.  No JVD.  No carotid bruits.  CARDIOVASCULAR:  Regular rate and rhythm.  2/6 systolic murmur  appreciated.  LUNGS:  Clear to auscultation bilaterally.  No rhonchi or wheezing.  ABDOMEN:  Soft, nontender, nondistended.  Positive bowel sounds.  EXTREMITIES:  No lower extremity edema.  Does  have an ecchymotic area to  left shin, which she relates to the fall.  She has decreased range of  motion in her left shoulder secondary to pain.  NEUROLOGIC:  The patient is alert and oriented x3.  No deficits noted.  Appears to be at her baseline.   LABORATORY DATA:  CBC shows white count of 10.4, hemoglobin of 13.9,  platelet count of 301.  Initial cardiac markers showed myoglobin 196, MB  of 2.5, troponin-I of less than 0.05.  BMET shows a sodium of 135,  potassium 4, chloride 99, CO2 26, glucose 158, BUN 21, creatinine 1.07,  calcium of 9.1.  EKG showed normal sinus rhythm, with left axis  deviation, question right bundle branch block.  No specific ST-wave  abnormalities.   X-ray of shoulder at the time showed anterior-inferior dislocation with  Bankart and Hill-Sachs fractures.  Chest x-ray showed chronic  interstitial lung markings.  More pronounced at the bases compared to  2007.  Question of progressive fibrosis.  Follow-up shoulder x-ray  showed successful reduction of left shoulder dislocation with associated  fracture fragments related to Hill-Sachs and Bankart fractures.   IMPRESSION:  This is a 77 year old white female with episode of syncope,  unknown cause, with dislocation and fracture of her left shoulder.   ASSESSMENT/PLAN:  1. Syncope.  It is unclear exactly the cause of this, whether this was      related to arrhythmia.  She had had an episode of this back 13      years ago.  In looking back at her history, she does have history      of dilated hypertrophic obstructive cardiomyopathy, apparently on      an echocardiogram in September 2007, prior to me seeing her.  She      also has a questionable history of left subclavian stenosis, with      subclavian steal, and had stenting done.  Apparently did not have      all history available.  Neurologically, she appears to be intact,      and I question whether the syncope was related to orthostatic-type      drop, or  whether it was related to a cardiac arrhythmia.  We will  get a 2D echocardiogram and carotid Dopplers on the patient, as      well as recheck her TSH and free T4 which is checked routinely in      the office and has been normal previously.  We will get      Cardiology's input as well if there is any other further      intervention, question whether we need a Holter monitor. On this      isolated event, she did not have any significant issues with chest      pain all or shortness of breath.  2. Left shoulder dislocation and Hill-Sachs Bankart fracture.  She was      seen assessed by Dr. Romeo Apple, and the recommendation is that she      remain in the immobilizer for least 7-10 days, and then will need      physical therapy following this, and will follow along with any      other recommendations they may have.  3. Managing pain.  She was initially given some Dilaudid the emergency      department, which caused her to have nausea and vomiting, but the      patient states she is doing well with the current Vicodin which she      was given.  4. Hypothyroidism.  As mentioned above, will check TSH and free T4 to      see if there are no significant abnormalities related to this.  5. Mild hypotension.  She was noted to have some hypotension initially      after getting the pain medicine, and I will hold off on her Avalide      at least for now.   DISPOSITION:  The patient will be admitted to telemetry and get routine  evaluation from Cardiology, and await further studies as they become  available.  If nothing shows up, then may be able to be discharged in  the next day or two, with routine followup with Cardiology as well as  with Orthopedics.      Catalina Pizza, M.D.  Electronically Signed     ZH/MEDQ  D:  04/11/2008  T:  04/11/2008  Job:  [id number]

## 2011-02-06 NOTE — Group Therapy Note (Signed)
NAME:  ZOHA, SPRANGER              ACCOUNT NO.:  [id number]   MEDICAL RECORD NO.:  [id number]          PATIENT TYPE:  INP   LOCATION:  A331                          FACILITY:  [facility]   PHYSICIAN:  Catalina Pizza, M.D.        DATE OF BIRTH:  03-26-1929   DATE OF PROCEDURE:  DATE OF DISCHARGE:                                 PROGRESS NOTE   SUBJECTIVE:  Ms. Luckman is a 77 year old white female who was admitted  for right lower lobe pneumonia, question hospital required.  She denies  any problems with fever or chills at this time since her breathing is  much improved, and denies any pain at this time.   OBJECTIVE:  VITAL SIGNS:  Temperature is 97.2; blood pressures have been  ranging between 146/84, one mentioned from 151/119, but normal this  morning; pulse 62; respirations 20; and sating 97% on 1 liter nasal  cannula.  GENERAL:  This is an elderly white female sitting in the side of the bed  in no acute distress.  HEENT:  Unremarkable.  LUNGS:  Few crackles at bases but good air movement throughout.  No  rhonchi or wheezing.  ABDOMEN:  Soft, nontender, and nondistended.  Positive bowel sounds.  HEART:  Regular rate and rhythm with 2/6 systolic murmur.  EXTREMITIES:  No lower extremity edema.  NEUROLOGIC:  Alert and oriented x3 noted.   LABORATORY DATA:  No new laboratory data is available.   IMPRESSION:  A 77 year old white female with right lower lobe pneumonia.   ASSESSMENT AND PLAN:  1. Right lower lobe pneumonia.  Continue with the vancomycin and      Levaquin.  She has been afebrile, does not have any further signs      of problems.  The patient will likely be able to go home with just      Levaquin at this time.  2. Left shoulder pain.  She may use her sling again secondary to pain.  3. Nausea, no further nausea at this time.  4. Diarrhea.  No further diarrhea noted.   DISPOSITION:  The patient will need another day of IV antibiotics and  may be able to go home tomorrow.  She  will ambulate in hall today and  see if able to do well.      Catalina Pizza, M.D.  Electronically Signed    ZH/MEDQ  D:  04/20/2008  T:  04/20/2008  Job:  [id number]

## 2011-02-06 NOTE — Consult Note (Signed)
NAME:  Carolyn Hughes, Carolyn Hughes              ACCOUNT NO.:  [id number]   MEDICAL RECORD NO.:  [id number]          PATIENT TYPE:  INP   LOCATION:  IC06                          FACILITY:  [facility]   PHYSICIAN:  Vickki Hearing, M.D.DATE OF BIRTH:  06-Dec-1928   DATE OF CONSULTATION:  DATE OF DISCHARGE:                                 CONSULTATION   REQUESTING PHYSICIAN:  Doug Sou, MD   HISTORY:  This is a 77 year old female was at home says she blacked out,  fell, dislocated her left shoulder with a fracture.  Brought to this  hospital by EMS.  Dr. Ethelda Chick attempted closed reduction  unsuccessful.  Called me for further treatment.  Dr. Renard Matter has been  called regarding her blacking out.  Remaining history and physical taken  from the Emergency Room records including her medicines and medical  problems, etc.   PHYSICAL EXAMINATION:  GENERAL:  On exam, she was lying in bed partially  sedated.  VITAL SIGNS:  Stable.  She was awake, but not alert.  She was oriented  to person and place.  NECK:  Supple, nontender.  EXTREMITIES:  Right shoulder had no deformity.  Right upper extremity  was well-aligned without contracture, subluxation, atrophy, tremor, or  tenderness.  Both hips, knees, and ankles were normal in terms of their  alignment.  There was a bruise on the left calf.  There was no joint  subluxations, atrophy, tremor of the muscles.  There was no  malalignment.  There was no tenderness in the lower extremities.  The  left upper extremity had a depression near the acromion and there was  pain, tenderness, swelling and good pulse in the left upper extremity.  NEUROLOGIC:  Full motor exam could not be performed and neurologic  function could not be confirmed due to the patient's anesthesia with  accommodate still on board.   The patient was given 40 mg of propofol IV.  Shoulder was manipulated  back into position.  Range of motion was performed and shoulder was  found to be  stable.  Pending radiographs.   She was placed on an immobilizer.  She will be admitted to the Medical  Service to evaluate her blacking out.  As far as orthopedics is  concerned, shoulder immobilizer for 7-10 days, then physical therapy to  regain motion, strength, and function.   ADDENDUM:  To follow regarding x-ray.      Vickki Hearing, M.D.  Electronically Signed     SEH/MEDQ  D:  04/10/2008  T:  04/11/2008  Job:  [id number]

## 2011-02-06 NOTE — Op Note (Signed)
Carolyn Hughes, Carolyn Hughes              ACCOUNT NO.:  [id number]   MEDICAL RECORD NO.:  [id number]          PATIENT TYPE:  OIB   LOCATION:  5151                         FACILITY:  [facility]   PHYSICIAN:  Almond Lint, MD       DATE OF BIRTH:  Jan 10, 1929   DATE OF PROCEDURE:  05/13/2009  DATE OF DISCHARGE:                               OPERATIVE REPORT   PREOPERATIVE DIAGNOSIS:  Right neck mass.   POSTOPERATIVE DIAGNOSIS:  Right neck mass.   PROCEDURE PERFORMED:  Excision of right neck mass.   SURGEON:  Almond Lint, MD   ASSISTANT:  None.   ANESTHESIA:  General and local.   FINDINGS:  Right neck mass approximately 1-1.5 cm very firm embedded in  the sternal head of the sternocleidomastoid.   SPECIMEN:  Right neck mass to Pathology.   ESTIMATED BLOOD LOSS:  10 mL.   COMPLICATIONS:  None known.   PROCEDURE:  Carolyn Hughes was identified in the holding area and taken to  operating room where she was placed supine on the operating room table.  General endotracheal anesthesia was induced.  Her neck was turned to the  left, and her right neck and chest were prepped and draped in sterile  fashion.  Time-out was performed according to the surgical safety  checklist.  When all was correct, we continued.  The mass was just  underneath her prior incision from her thyroidectomy for thyroid cancer  8 years ago.  The incision was opened up with a 15 blade right over the  mass.  This was indeed subplatysmal.  The Metzenbaum scissors were used  to dissect the platysma off the anterior surface of the  sternocleidomastoid and the mass.  This was very hard.  The mass was  separated from the muscle with Metzenbaum scissors initially.  There was  a large vein that was encountered, and this was divided and suture  ligated.  The mass was then removed the rest away from the  sternocleidomastoid with a Bovie electrocautery.  It was irregularly  shaped, and again was about 1 x 1.5 x 0.8 cm intramuscular.  The  wound  was inspected for hemostasis.  There were 2 small sites with oozing that  were coagulated.  This was reirrigated and reinspected for hemostasis,  and the wound was dry.  Local anesthetic was administered to the skin  surrounding the incision.  The wound was then  closed with 3-0 Vicryl interrupted deep dermal sutures and 4-0 Monocryl  subcuticular suture.  The skin was cleaned and dried and dressed with  Dermabond.  The patient was awakened from anesthesia and taken to PACU  in stable condition.      Almond Lint, MD  Electronically Signed     FB/MEDQ  D:  05/13/2009  T:  05/13/2009  Job:  [id number]

## 2011-02-06 NOTE — Discharge Summary (Signed)
Carolyn Hughes, Carolyn Hughes              ACCOUNT NO.:  [id number]   MEDICAL RECORD NO.:  [id number]          PATIENT TYPE:  INP   LOCATION:  IC06                          FACILITY:  [facility]   PHYSICIAN:  Catalina Pizza, M.D.        DATE OF BIRTH:  10/31/1928   DATE OF ADMISSION:  04/10/2008  DATE OF DISCHARGE:  07/22/2009LH                               DISCHARGE SUMMARY   DISCHARGE DIAGNOSES:  1. Syncope of unknown cause.  2. Left shoulder dislocation and Hill-Sachs, Bankart fracture.  3. Hypothyroidism.  4. Hypertension.  5. Hypertrophic obstructive cardiomyopathy.  6. Carotid artery disease.   DISCHARGE MEDICATIONS:  She will be discharged on Synthroid 88 mcg p.o.  daily, note change in micrograms as previous.  She is to stop taking  Avalide 150/12.5.  She was started on metoprolol 50 mg b.i.d., is on  calcium 600 mg p.o. daily with vitamin D.  Aspirin 81 mg p.o. daily.  She was also given prescription for Vicodin 5/500 one tablet q. 6 h.  p.r.n. for acute pain #20 given to her.   BRIEF HISTORY OF PRESENT ILLNESS:  Carolyn Hughes is a pleasant 77 year old  white female who states that she was in her usual state of health when  she walked into her house and fell down.  She did not specifically say  that she lost consciousness, although she did mention that she felt like  she blacked out, but knew that she hit the floor, so this is very vague  and unclear.  At that time, she knew that she had hurt her shoulder and  crawled over to the phone, and called one of her friends who then  subsequently called 911 and she was brought to the hospital for further  assessment.  She was seen by Dr. Romeo Apple and the left shoulder was  relocated and back to proper position and she was admitted for further  workup of questioned syncope.   PHYSICAL EXAMINATION:  VITAL SIGNS:  At time of discharge, orthostatic  blood pressures show blood pressure 123/65 to 117/66, pulse remained  stable at 67 to 72 range.  GENERAL:  Elderly white female sitting on the side of the bed in no  acute stress.  HEENT:  Unremarkable.  LUNGS:  Few crackles at bases bilaterally, but no significant  abnormality.  CARDIOVASCULAR:  Regular rate and rhythm with 3/6 systolic murmur.  ABDOMEN:  Soft, nontender, and nondistended with positive bowel sounds.  EXTREMITIES:  No lower extremity edema.  She continues to have pain in  the left shoulder.  SKIN:  She has some ecchymosis to her left biceps area as well as her  left shin, but no other extremity deficits.  NEUROLOGIC:  Alert and oriented x3.  No deficits noted.   LABORATORY DATA OBTAINED DURING HOSPITALIZATION:  CBC showed white count  of 10.4, hemoglobin of 13.9, platelet count of 301.  This slightly  trended down to white count 7.5, hemoglobin 11.5, platelet count of 268.  Cardiac marker initially showed MB at 2.5, troponin I less than 0.05,  myoglobin 196.  CK repeat showed  CK of 273, MB of 2.7, troponin I of  0.03.  TSH showed a level of 0.385, free T4 was 1.49.   X-RAYS OBTAINED:  Carotid Dopplers revealed estimated 50-69% on the  right side of stenosis.  She also had approximately same thing on the  left side as well.  Left shoulder after reduction showed successful  reduction of left shoulder, but continued to show fracture fragments  related to Hill-Sachs and her Bankart fracture noted.  Initial chest x-  ray showed chronic interstitial lung markings, appear little more  pronounced as compared to Feb 21, 2006, question whether it is showing  progressive fibrosis or not.   HOSPITAL COURSE:  1. Syncope.  She was seen and assessed by [facility] Cardiology, unclear      if she in fact did have syncope although she does have history of      hypertrophic obstructive cardiomyopathy, question whether this is      related to it.  She did have a 2-D echo, which again showed normal-      sized left ventricle and showed moderate asymmetric septal      hypertrophy and showed a  moderate transaortic valve gradient of 26      mmHg, but relatively normal left ventricular systolic function.      Carotid Dopplers also did reveal some carotid stenosis, but not      significant enough, did not have any other neurologic complaints      related to syncope given this history though the patient would      benefit to having routine followup with Cardiology, question      whether to have an event monitor placed or further heart      assessment.  We will follow up with [facility]iology for this.  2. Left shoulder dislocation, Hill-Sachs, Bankart fracture.  Seen      initially by Dr. Romeo Apple and consider immobilization for 7-10 days      and then will need physical therapy beyond this, unclear exact      activity suggestions for physical therapy at this time and will      defer to orthopedics and question whether the patient will need      further followup with orthopedics, but will follow up with this.      The patient was given a prescription for Vicodin, and help out with      pain especially at night when sleeping.  She has rarely needed it      during the hospitalization, more so initially and hopefully will      improve further out.  3. Hypothyroidism.  Her TSH level was significantly low, but not in      the abnormal range, but will decrease the amount of her thyroid      dose and will need recheck in approximately 4 weeks.  4. Hypertension.  At the time of discharge, do not have any signs of      hypertension.  We will start her on beta-blocker and with holding      off on the Avalide resuming.  If continuing to have worsening      problems with hypertension, then we will have him back on the      Avalide.   DISPOSITION:  The patient will be discharged to home with the above-  mentioned medicines.  We will get physical therapy and occupational  therapy to assess the patient's needs in home and then  will likely need  physical therapy whether in home or as an  outpatient basis.  This was  set up by home health.  The patient will follow up with me in  approximately 2 weeks.      Catalina Pizza, M.D.  Electronically Signed     ZH/MEDQ  D:  04/14/2008  T:  04/15/2008  Job:  [id number]   cc:   Vickki Hearing, M.D.   Gerrit Friends. Dietrich Pates, MD, [facility]  [address]  [place], [place] [address]

## 2011-02-06 NOTE — H&P (Signed)
NAME:  Carolyn Hughes, Carolyn Hughes              ACCOUNT NO.:  [id number]   MEDICAL RECORD NO.:  [id number]          PATIENT TYPE:  INP   LOCATION:  A331                          FACILITY:  [facility]   PHYSICIAN:  Edward L. Juanetta Gosling, M.D.DATE OF BIRTH:  1929/06/29   DATE OF ADMISSION:  04/18/2008  DATE OF DISCHARGE:  LH                              HISTORY & PHYSICAL   Carolyn Hughes is a 77 year old who has had multiple medical problems  including a syncopal episode about a week ago.  She was discharged from  the hospital on the 22nd and did well, then developed increasing cough,  congestion, shortness of breath and wheezing.  Developed fever and came  back to the emergency room where she was found to have a right lower  lobe pneumonia.  She has had, as mentioned, a syncopal episode about a  week ago and ended up with a fracture and dislocation of her shoulder.   PAST MEDICAL HISTORY:  1. Appendectomy.  2. Total abdominal hysterectomy and bilateral oophorectomy.  3. She had endometrial uterine cancer.  4. She had a right nephrectomy secondary to renal cell cancer.  5. 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  6. 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  She is wearing an event monitor now.  7. She says she has had pneumonia in the past.  She did have some      chills, but she is not certain if she had a fever.   CURRENT MEDICATIONS:  1. Synthroid 88 mcg daily.  2. Metoprolol 50 mg b.i.d.  3. Calcium 600 mg with vitamin D daily.  4. Aspirin 81 mg daily.  5. Vicodin 5/500 as needed for pain.   REVIEW OF SYSTEMS:  Otherwise is essentially negative.  She says she has  a pretty significant pain in her shoulder.   ALLERGIES:  1. She is allergic to PENICILLIN.  2. She gets nausea and vomiting with DILAUDID.  3. She does not tolerate CODEINE, although she has tolerated the      Vicodin.   SOCIAL HISTORY:  She is widowed x2.  She lives alone.   She does not use  alcohol.  She does not use tobacco.  She does not use any illicit drugs.   FAMILY HISTORY:  Positive for multiple family members who have had  various types of cancers.   PHYSICAL EXAMINATION:  GENERAL:  A well-developed, well-nourished female  who does not appear to be in any acute distress.  She looks fairly  comfortable.  Her left arm is still markedly bruised and inflamed in  appearance.  VITAL SIGNS:  Her temperature is 97.8, pulse 70, respirations 22, blood  pressure 125/75, O2 saturations 98%.  Her height is 65 inches, weight is  79 kg.  HEENT:  Her pupils are reactive to light and accommodation.  Her nose  and throat are clear.  NECK:  Supple without masses.  CHEST:  Fairly clear with some rhonchi bilaterally.  HEART:  Regular without gallop.  ABDOMEN:  Soft.  EXTREMITIES:  No edema.  The left arm is as mentioned.  CNS:  Grossly intact.   LABORATORY DATA:  Her urine shows a small amount of blood, trace  ketones.  Microscopic shows 21-50 white cells, 3-6 reds.  Comprehensive  metabolic  profile shows her BUN is 18, creatinine 1.07.  Liver  functions is normal.  CBC revealed a white count of 8800, hemoglobin  12.6, platelets 359, and her blood cultures are negative.   ASSESSMENT:  She has got pneumonia.  She has had multiple other  problems.  She has hypertension which is pretty stable.  She has  hypothyroidism.  She has had a change in her dose of her Synthroid, and  at this point I think we should continue with treatments.  She is on  antibiotics.  I am going to put her on vancomycin and Levaquin because  she has potentially hospital-acquired pneumonia and will follow from  there.      Edward L. Juanetta Gosling, M.D.  Electronically Signed     ELH/MEDQ  D:  04/18/2008  T:  04/18/2008  Job:  [id number]   cc:   Carolyn Hughes, M.D.  Fax: [phone]

## 2011-02-09 NOTE — Discharge Summary (Signed)
[name], Carolyn Hughes              ACCOUNT NO.:  [id number]   MEDICAL RECORD NO.:  [id number]          PATIENT TYPE:  INP   LOCATION:  A331                          FACILITY:  [facility]   PHYSICIAN:  Catalina Pizza, M.D.        DATE OF BIRTH:  1929/07/18   DATE OF ADMISSION:  04/18/2008  DATE OF DISCHARGE:  07/29/2009LH                               DISCHARGE SUMMARY   DIAGNOSES:  1. Right lower lobe pneumonia.  2. Left shoulder pain related to [place]-Sachs [place] fracture.  3. Nausea.  4. Diarrhea.  5. Hypothyroidism.  6. Hypertension.   DISCHARGE MEDICATIONS:  1. Synthroid 88 mcg p.o. daily.  2. Metoprolol 50 mg b.i.d.  3. Aspirin 81 mg p.o. daily.  4. Advil 600 mg q.6 p.r.n. for pain.  5. Levaquin 500 mg once daily x7 days.  6. Phenergan 12.5-25 mg q.6 h. p.r.n. for nausea.  7. Protonix 40 mg daily.   BRIEF HISTORY OF PRESENT ILLNESS:  Ms. Carolyn Hughes is a 77 year old white  female with multiple medical problems, who had a syncopal episode  approximately 1 week prior to this readmission, who had a fracture of  her left shoulder due to dislocation, was seen by Dr. Romeo Apple and fell  okay to go home with physical therapy.  Upon going home, she started  having a little bit fever and worsening cough and shortness of breath  and came back to the hospital and was admitted on April 18, 2008, by Dr.  Juanetta Gosling for right lower lobe pneumonia.   PHYSICAL EXAMINATION AT THE TIME OF DISCHARGE:  VITAL SIGNS:  Temperature is 97.9, blood pressure 163/79, pulse is 67, respirations  20, sating 96% on room air.  GENERAL:  This is an elderly female lying in bed in no acute distress.  HEENT:  Unremarkable.  LUNGS:  Good air movement throughout, did not appreciate any crackles at  this time.  No rhonchi or wheezing noted.  ABDOMEN:  Soft, nontender, nondistended, positive bowel sounds.  HEART:  Regular rate and rhythm, 2/6 systolic murmur.  EXTREMITIES:  No lower extremity edema.  NEUROLOGIC:  Alert and  oriented x3.  No deficits noted.   LABORATORY DATA OBTAINED DURING HOSPITALIZATION:  UA showed trace  ketones, small blood, large leukocytes.  Urine micro showed few  bacteria.  Initial CMET was normal except for glucose 117, BUN 18,  creatinine of 1.07.  All liver enzymes were normal.  Initial CBC showed  white count 8.8, hemoglobin of 12.6, platelet count of 359.  H1N1 screen  was negative.  Urine culture showed 75,000 colonies multibacterial, no  specific growth.  Blood cultures x2 were negative.   Chest x-ray:  Chest x-ray revealed new right lower lobe opacity  concerning for developing pneumonia.   HOSPITAL COURSE:  1. Right lower lobe pneumonia.  Dr. Juanetta Gosling initially started her on      vancomycin and Levaquin.  Due to her recent hospitalization, the      patient immediately was afebrile and remained so for 2 days prior      to discharge, did have some cough, but improved  with symptomatic      medication.  We will send the patient home with Levaquin for      another week.  2. Left shoulder pain.  The patient had left shoulder and the patient      is not using a sling quite often.  She is to follow up with Dr.      Romeo Apple for any further input on this.  3. Nausea.  Improved nausea, not have any prior to discharge, and we      will send her home with Phenergan just in case.  4. Diarrhea.  Did not have any further diarrhea symptoms at the time      of discharge.  No therapy initiated.  5. Hypertension.  The patient will resume all her medications as      previously prescribed.  6. Hypothyroidism.  Continue on her thyroid medicine.  We will need to      recheck this in approximately 1 month.   DISPOSITION:  The patient will be discharged back to home.  We will  continue with home health physical therapy and assessment in-house for  any further needs.  The patient will follow up with me in approximately  2-3 weeks.  We will need to get another chest x-ray eventually to see   about resolution of this pneumonia.      Catalina Pizza, M.D.  Electronically Signed     ZH/MEDQ  D:  06/19/2008  T:  06/20/2008  Job:  [id number]

## 2011-02-09 NOTE — H&P (Signed)
[facility]  Patient:    Carolyn Hughes, Carolyn Hughes Visit Number: [id number] MRN: [id number]          Service Type: MED Location: MICU 2102 01 Attending Physician:  Fredric Dine Dictated by:   Elpidio Anis, M.D. Admit Date:  06/16/2001 Discharge Date: 06/18/2001                           History and Physical  HISTORY OF PRESENT ILLNESS:  A 77 year old female with a history of goiter since May 2002.  She has had gradual increase in size with some choking sensation.  She has had nocturnal smothering sensation.  She had a thyroid scan which showed evidence of multinodular goiter with a large mass on the left side and small masses on the right side.  Ultrasound during a carotid duplex scan showed a large thyroid mass on the left.  The patient had arteriogram which showed a large inferior thyroid artery which fed a hypervascular mass, inferior pole of the region of the left lower thyroid. The patient is scheduled for lobectomy and possible subtotal thyroidectomy based on pathology.  PAST MEDICAL HISTORY:  She has history of left subclavian stenosis status post stenting.  MEDICATIONS: 1. Aspirin 81 mg q.d. 2. Plavix 75 mg q.d.  PAST SURGICAL HISTORY:  Appendectomy, total abdominal hysterectomy with bilateral salpingo-oophorectomy, right nephrectomy.  ALLERGIES:  PENICILLIN.  REVIEW OF SYSTEMS:  Positive for bilateral cataracts, urinary frequency, easy bruising.  SOCIAL HISTORY:  She is a widow, retired.  She does not drink, smoke, or use drugs.  PHYSICAL EXAMINATION:  VITAL SIGNS:  Blood pressure 140/80, pulse 70, respirations 18.  HEENT:  Unremarkable except for immature bilateral cataracts.  NECK:  Supple.  Large mass left lobe of thyroid with some fixation.  No nodes felt.  CHEST:  Clear to auscultation.  No rales, rubs, rhonchi, or wheezes.  HEART:  Regular rate and rhythm with grade 2 systolic ejection murmur.  ABDOMEN:  Soft,  nontender.  No masses.  EXTREMITIES:  Unremarkable except for ecchymosis with small hematoma left groin.  IMPRESSION: 1. Left thyroid mass, rule out thyroid carcinoma. 2. History of left subclavian stenosis with subclavian steel status post    stenting with reversal of plug.  PLAN:  As noted above. Dictated by:   Elpidio Anis, M.D. Attending Physician:  Fredric Dine DD:  07/03/01 TD:  07/03/01 Job: [id number]

## 2011-02-09 NOTE — Discharge Summary (Signed)
[facility]  Patient:    Carolyn Hughes, Carolyn Hughes Visit Number: [id number] MRN: [id number]          Service Type: OUT Location: RAD Attending Physician:  Julieanne Cotton Dictated by:   Elpidio Anis, M.D. Admit Date:  08/07/2001 Discharge Date: 08/07/2001                             Discharge Summary  DISCHARGE DIAGNOSES: 1. Hurthle cell carcinoma of the thyroid. 2. Left subclavian stenosis status post left subclavian stenting. 3. Nontoxic multinodular goiter.  SPECIAL PROCEDURE:  Near total thyroidectomy July 04, 2001.  Patient is a 77 year old female with a history of a goiter dating back to May 2002.  She had gradual increase in size of her goiter with a choking sensation.  She had a thyroid scan which showed evidence of multinodular goiter with a mass of the left side and smaller masses on the right side. Ultrasound was done and this showed a large thyroid mass on the left.  She had arteriogram which showed a large inferior thyroid artery which fed a hypervascular mass at the inferior pole of the left lower thyroid lobe.  She is scheduled for thyroid lobectomy and possible near total thyroidectomy. The patient was admitted to day surgery and underwent near total thyroidectomy on July 04, 2001.  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  She had an uneventful operation and an uneventful postoperative course.  She had no problems with her voice and no difficulty swallowing.  She had minimal JP drainage.  She was felt to be stable on the second postoperative day and was discharged home in stable satisfactory condition.  DISCHARGE MEDICATIONS: 1. Tylox 1 q.4h. as needed for pain. 2. Levaquin 500 mg q.d. for 7 days. Dictated by:   Elpidio Anis, M.D. Attending Physician:  Julieanne Cotton DD:  08/14/01 TD:  08/15/01 Job: [id number]

## 2011-02-09 NOTE — Op Note (Signed)
[facility]  Patient:    Carolyn Hughes, Carolyn Hughes Visit Number: [id number] MRN: [id number]          Service Type: SUR Location: 3 A323 01 Attending Physician:  Dessa Phi Dictated by:   Elpidio Anis, M.D. Admit Date:  07/04/2001   CC:         Dr. Franky Macho  Dr. Sharin Grave  Dr. Tanda Rockers   Operative Report  PREOPERATIVE DIAGNOSIS:  Left thyroid mass, rule out carcinoma.  POSTOPERATIVE DIAGNOSIS:  Suspected Hurthle cell tumor, left thyroid lobe, and multinodular goiter.  PROCEDURE:  Near-total thyroidectomy.  SURGEONS:  Elpidio Anis, M.D., Franky Macho, M.D.  ANESTHESIA:  General anesthesia.  DESCRIPTION OF PROCEDURE:  Under general anesthesia, the patients neck and chest were prepped into a sterile field.  A lower collar incision was made and extended down through the platysma.  The strap muscles were opened in the midline.  Retractor was placed.  The right strap muscles were mobilized off the gland.  Lateral veins were dissected, doubly clipped and divided. Inferior pole was dissected initially because of very large vascular supply. These major vessels were dissected with wide-angle clamped, tied with double ligatures of 3-0 silk, clipped and divided.  Next, the superior pole was dissected, taking care to not injure the superior laryngeal nerve.  Vessels were dissected, isolated and then tied with double ligatures of 2-0 silk.  The proximal end was clipped with Hemoclip.  After the superior pole was dissected, the gland was further dissected off the trachea.  Staying away from the inferior pole of the larynx, the tissue was then divided close to the gland.  The two parathyroids were noted and the vascular supply to them was preserved.  Recurrent laryngeal nerve was noted in this course and we were at least 2 cm away from the recurrent laryngeal nerve.  The gland was then separated from the fascia and was sent to pathology for frozen  section. The dissection along ______ fascia was carried across the trachea.  The isthmus was dissected.  Next, the right lobe of the gland was mobilized. Superior and inferior poles were dissected in a similar manner.  Slightly increased amount of tissue was left around the parathyroids and the recurrent nerve insertion.  The gland was divided with straight clamps.  It was then sent to pathology as a permanent section.  Hemostasis on each side was achieved.  There was no drainage at the end of the procedure.  A JP drain was placed and brought out through the lower skin flap.  Strap muscles were closed in the midline with running 3-0 Biosyn.  Subcutaneous tissue and platysma were closed with 3-0 Biosyn.  Skin was closed with subcuticular 4-0 Dexon.  She tolerated the procedure well.  The drain was secured with 3-0 nylon.  OpSite dressing was placed.  Sponge, needle, instrument and blade counts were verified as correct. She was awakened from anesthesia without difficulty, transferred to a bed and taken to the postanesthetic recovery area.  The patient did not have any respiratory difficulty, nor were there any voice changes at the completion of the procedure. Dictated by:   Elpidio Anis, M.D. Attending Physician:  Dessa Phi DD:  07/04/01 TD:  07/04/01 Job: [id number]

## 2011-02-09 NOTE — Procedures (Signed)
NAME:  TANVEER, DOBBERSTEIN              ACCOUNT NO.:  [id number]   MEDICAL RECORD NO.:  [id number]          PATIENT TYPE:  OUT   LOCATION:  RAD                           FACILITY:  [facility]   PHYSICIAN:  Gerrit Friends. Dietrich Pates, MD, FACCDATE OF BIRTH:  [birth year]   DATE OF PROCEDURE:  06/07/2006  DATE OF DISCHARGE:                                  ECHOCARDIOGRAM   REFERRING PHYSICIAN:  Dr. Erby Pian   CLINICAL DATA:  [age]-year-old woman with hypertension and murmur.  M-  mode aorta 3.3, left atrium 4.8, septum 3.1, posterior wall 1.3, LV diastole  2.9, LV systole 1.4.   1. Technically suboptimal but adequate echocardiographic study.  2. Mild to moderate left atrial enlargement; mild right atrial      enlargement.  Normal right ventricle.  3. Modest thickening of the leaflets of a trileaflet aortic valve; mild      calcification of the proximal ascending aorta.  4. Normal tricuspid valve; trivial regurgitation; mild elevation in      estimated RV systolic pressure.  5. Normal pulmonic valve and proximal pulmonary artery.  6. Mild diffuse thickening of the mitral valve with systolic anterior      motion and mild regurgitation.  7. Small left ventricular chamber dimensions; moderate to marked      hypertrophy with asymmetric septal involvement.  Regional and global LV      systolic function are hyperdynamic with cavity obliteration noted      during systole.  There is a measurable gradient, the exact location of      which is uncertain, but probably in the left ventricular outflow tract.      This is not ideally measured, but is substantial with a peak gradient      in excess of 60 mmHg.  8. Normal IVC.   IMPRESSION:  Biatrial enlargement; findings consistent with hypertrophic  obstructive cardiomyopathy and substantial left ventricular outflow tract  gradient; hyperdynamic left ventricular systolic function.      Gerrit Friends. Dietrich Pates, MD, [facility]  Electronically Signed     RMR/MEDQ   D:  06/07/2006  T:  06/08/2006  Job:  [id number]

## 2011-02-21 ENCOUNTER — Ambulatory Visit (INDEPENDENT_AMBULATORY_CARE_PROVIDER_SITE_OTHER): Payer: Medicare Other | Admitting: Internal Medicine

## 2011-02-21 DIAGNOSIS — K921 Melena: Secondary | ICD-10-CM

## 2011-02-21 DIAGNOSIS — Z7982 Long term (current) use of aspirin: Secondary | ICD-10-CM

## 2011-02-21 DIAGNOSIS — K625 Hemorrhage of anus and rectum: Secondary | ICD-10-CM

## 2011-02-21 DIAGNOSIS — R933 Abnormal findings on diagnostic imaging of other parts of digestive tract: Secondary | ICD-10-CM

## 2011-02-23 ENCOUNTER — Ambulatory Visit (HOSPITAL_COMMUNITY)
Admission: RE | Admit: 2011-02-23 | Discharge: 2011-02-23 | Disposition: A | Discharge status: Home / Self Care | Payer: Medicare Other | Source: Ambulatory Visit | Attending: Internal Medicine | Admitting: Internal Medicine

## 2011-02-23 ENCOUNTER — Encounter (INDEPENDENT_AMBULATORY_CARE_PROVIDER_SITE_OTHER): Payer: Medicare Other | Admitting: Internal Medicine

## 2011-02-23 ENCOUNTER — Other Ambulatory Visit (INDEPENDENT_AMBULATORY_CARE_PROVIDER_SITE_OTHER): Payer: Self-pay | Admitting: Internal Medicine

## 2011-02-23 DIAGNOSIS — K625 Hemorrhage of anus and rectum: Secondary | ICD-10-CM

## 2011-02-23 DIAGNOSIS — R933 Abnormal findings on diagnostic imaging of other parts of digestive tract: Secondary | ICD-10-CM

## 2011-02-23 DIAGNOSIS — Z8553 Personal history of malignant neoplasm of renal pelvis: Secondary | ICD-10-CM | POA: Insufficient documentation

## 2011-02-23 DIAGNOSIS — K5730 Diverticulosis of large intestine without perforation or abscess without bleeding: Secondary | ICD-10-CM | POA: Insufficient documentation

## 2011-02-23 DIAGNOSIS — D126 Benign neoplasm of colon, unspecified: Secondary | ICD-10-CM | POA: Insufficient documentation

## 2011-02-23 DIAGNOSIS — Z8544 Personal history of malignant neoplasm of other female genital organs: Secondary | ICD-10-CM | POA: Insufficient documentation

## 2011-02-23 DIAGNOSIS — Z85819 Personal history of malignant neoplasm of unspecified site of lip, oral cavity, and pharynx: Secondary | ICD-10-CM | POA: Insufficient documentation

## 2011-02-23 DIAGNOSIS — Z800 Family history of malignant neoplasm of digestive organs: Secondary | ICD-10-CM | POA: Insufficient documentation

## 2011-03-19 NOTE — Op Note (Signed)
NAME:  Carolyn Hughes, MCLOUGHLIN              ACCOUNT NO.:  [id number]  MEDICAL RECORD NO.:  [id number]           PATIENT TYPE:  O  LOCATION:  DAYP                          FACILITY:  [facility]  PHYSICIAN:  Lionel December, M.D.    DATE OF BIRTH:  May 06, 1929  DATE OF PROCEDURE:  02/23/2011 DATE OF DISCHARGE:                              OPERATIVE REPORT   PROCEDURE:  Colonoscopy.  INDICATION:  [name] is a 77 year old Caucasian female who presented with acute onset of rectal bleeding on May 3 and was seen in emergency room.  Her H and H was normal.  She had abdominopelvic CT via emergency room which revealed perirectal soft tissue stranding as well as rectal wall thickening and therefore proctitis  raised as the possibility.  She has not had any more bleeding episodes.  The patient's personal history is positive for 4 malignancies including renal, uterine, thyroid, and pharyngeal but she has never been screened for colorectal carcinoma.  One brother had CRC at age 68 and died at 48, other brother had pancreatic carcinoma at 77 and died within the year to two.  Procedures were reviewed with the patient.  Informed consent was obtained.  MEDICATIONS FOR CONSCIOUS SEDATION:  Demerol 25 mg IV and Versed 4 mg IV.  FINDINGS:  Procedure performed in endoscopy suite.  The patient's vital signs and O2 sat were monitored during the procedure and remained stable.  The patient was placed in left lateral recumbent position. Rectal examination was performed.  No abnormality noted on external or digital exam.  Pentax videoscope was placed through rectum and advanced under vision into sigmoid colon and beyond.  Preparation was satisfactory.  She had scattered diverticula throughout the colon but predominating the sigmoid colon.  Scope was passed into cecum which was identified by appendiceal orifice and ileocecal valve.  As the scope was withdrawn, colonic mucosa was carefully examined.  There were 2  small polyps, both of which were ablated via cold biopsy, one was at transverse colon and second one was sigmoid colon.  Rectal mucosa was normal.  Scope was retroflexed to examine anorectal junction and no abnormalities were noted.  Endoscope was then withdrawn.  Withdrawal time was 17 minutes.  The patient tolerated the procedure well.  FINAL DIAGNOSES: 1. Pancolonic diverticulosis.  Most of the diverticula at sigmoid     colon and moderate number. 2. Two small polyps ablated via cold biopsy as above. 3. No abnormality noted to rectal mucosa. 4. A CT changes either false positive or she could have had a self-     limiting bout of proctitis and has recovered. 5. I would have felt that she most likely bled from colonic     diverticula.  RECOMMENDATIONS: 1. Standard instructions given. 2. High-fiber diet, plus fiber supplement 3-4 g daily. 3. The patient will resume her low-dose ASA. 4. I will be contacting with results of biopsy and further     recommendations if any.          ______________________________ Lionel December, M.D.  NR/MEDQ  D:  02/23/2011  T:  02/23/2011  Job:  [id number]  cc:  Catalina Pizza, M.D. Fax: [phone]  Electronically Signed by Lionel December M.D. on 03/19/2011 11:19:39 AM

## 2011-03-19 NOTE — Consult Note (Signed)
NAME:  Carolyn Hughes, Carolyn Hughes              ACCOUNT NO.:  [id number]  MEDICAL RECORD NO.:  [id number]           PATIENT TYPE:  O  LOCATION:  DAY                           FACILITY:  [facility]  PHYSICIAN:  Lionel December, M.D.    DATE OF BIRTH:  12-Jul-1929  DATE OF CONSULTATION:  02/21/2011 DATE OF DISCHARGE:  09/01/2010                                CONSULTATION   REASON FOR CONSULTATION:  Abnormal CT, rectal bleeding.  HISTORY OF PRESENT ILLNESS:  Carolyn Hughes is a 77 year old white female presenting today as a referral from Dr. Keane Police office.  She apparently was seen in the emergency room on May 4.  She was complaining of left lower quadrant pain and rectal bleeding.  Her CT revealed mild dilatation of the left renal collecting system.  It was recommended that correlated with renal function and there may be a mild component of hydronephrosis.  There was no obstructing calculi or masses identified. She also had perirectal soft tissue stranding and suggestion of rectal wall thickening.  This may be related to inflammatory proctitis/colitis. A rectal tumor could not be excluded.  She had a stable low-density abnormality adjacent to the external iliac vessels representing lymphocele or ovarian remnant.  She states that her rectal bleeding lasted approximately an hour.  Her abdominal pain lasted approximately 2 hours.  She also has back pain which she has had for months.  She was discharged home to the ER and advised to follow up with her family physician.  She says she is having a bowel movement one a day.  Her stools are usually brown.  They are normal caliber.  She does tell me that she does occasionally see blood and wears a pad in her panties for this.  Her appetite has been good.  There has been no weight loss. She does tell me she occasionally has rectal pain.  Labs on May 3; WBC count 14.4, RBC 4.13, hemoglobin 12.8, hematocrit 38.5, MCV is 93.2, neutrophils 8, lymphs 11.  Protime 14.0,  INR 1.06. Sodium 134, potassium 3.7, chloride 99, glucose 123, BUN 22, creatinine 1.17, calcium 9.0.  Carolyn Hughes does have a history of cancer and underwent a right nephrectomy in 1997.  She has also had cancer of her thyroid. She has also had a total hysterectomy with a bilateral oophorectomy for uterine cancer.  There is a family history of colon cancer in a first- degree relative.  She had 1 brother with colon cancer.  Carolyn Hughes has never undergone a screening colonoscopy in the past.    Surgeries, she has had a right neck mass removed in August 2010.  The biopsy revealed a metastatic clear cell carcinoma.  In 2002, she underwent a thyroid resection for carcinoma.  In 1997, she had a right nephrectomy for renal carcinoma.  In 1968, she had a total abdominal hysterectomy with a bilateral oophorectomy for uterine cancer.  She has had an appendectomy.  She does have a stent to her subclavian stenosis.  Medical history includes hypertension, high cholesterol and hypothyroidism. She is allergic to penicillin FAMILY HISTORY:  Her mother is deceased from stomach cancer at age  44. Her father is deceased from a CVA.  Four sisters are deceased.  One sister from ovarian cancer, one sister from kidney cancer, one sister from brain cancer and one sister had cancer but she was unsure where  the cancer was located.  She had seven brothers, 6 are deceased.  One brother had pancreatic cancer and one had colon cancer.  One had CVA and had a hx of diabetes. She has one brother in his 54 in good health.  FAMILY HISTORY:  She is widowed.  She is retired from [employer].  She does not smoke, drink or do drugs and she does not have any children.  OBJECTIVE:  VITAL SIGNS:  Her weight is 158, her height is 5 feet 4-1/2 inches, her temperature is 97.1, her blood pressure is 134/62, and her pulse is 72. HEENT:  She has natural teeth.  Her oral mucosa is moist.  There are no lesions.  Her conjunctivae are pink.   Her sclerae are anicteric.  NECK: Her thyroid is normal.  I did not feel any nodules.  There is no cervical lymphadenopathy. LUNGS:  Clear. HEART:  Regular rate and rhythm.  There is a loud murmur. ABDOMEN:  Soft.  Bowel sounds are positive.  No masses felt.  There is no edema to her extremities.  Her stool was brown guaiac positive.  ASSESSMENT:  Carolyn Hughes is a 77 year old female presenting with a history of rectal bleeding. She was heme positive today in the office. Recent CT suggests rectal wall thickening. Furthermore family history is positive   for colon cancer in a first-degree relative.A colonic neoplasm is high  on the list of possibilities although she could have proctitis.   RECOMMENDATIONS:  We will schedule a colonoscopy in thevery near future.   The risks and benefits were reviewed with the patient and she is agreeable.    ______________________________ Dorene Ar, NP   ______________________________ Lionel December, M.D.    TS/MEDQ  D:  02/21/2011  T:  02/22/2011  Job:  [id number]  Electronically Signed by Dorene Ar PA on 02/22/2011 02:14:31 PM Electronically Signed by Lionel December M.D. on 03/19/2011 11:18:49 AM

## 2011-03-25 NOTE — Consult Note (Signed)
  NAME:  Carolyn Hughes, Carolyn Hughes              ACCOUNT NO.:  [id number]  MEDICAL RECORD NO.:  [id number]  LOCATION:                                 FACILITY:  PHYSICIAN:  Lionel December, M.D.    DATE OF BIRTH:  [birth year]  DATE OF CONSULTATION: DATE OF DISCHARGE:                                CONSULTATION   ADDENDUM  Her medications include Diovan 160/12.5 a day, levothyroxine 88 mcg a day, aspirin 81 mg a day, WelChol 625 three a day, Calcium with D one a day, Dulcolax 2 a day, Canasa 1000 mg suppositories one at night.    ______________________________ Dorene Ar, NP   ______________________________ Lionel December, M.D.    TS/MEDQ  D:  02/21/2011  T:  02/22/2011  Job:  [id number]  Electronically Signed by Dorene Ar PA on 03/05/2011 11:24:51 AM Electronically Signed by Lionel December M.D. on 03/25/2011 12:34:44 PM

## 2011-06-22 LAB — DIFFERENTIAL
Basophils Absolute: 0
Basophils Absolute: 0
Basophils Relative: 1
Eosinophils Absolute: 0.4
Eosinophils Relative: 4
Eosinophils Relative: 5
Lymphocytes Relative: 18
Lymphocytes Relative: 24
Lymphs Abs: 1.4
Lymphs Abs: 1.5
Monocytes Absolute: 0.6
Monocytes Absolute: 0.6
Monocytes Relative: 7
Monocytes Relative: 9
Monocytes Relative: 10
Neutro Abs: 5
Neutro Abs: 5.1
Neutro Abs: 4.8
Neutrophils Relative %: 67
Neutrophils Relative %: 66

## 2011-06-22 LAB — CBC
HCT: 35 — ABNORMAL LOW
Hemoglobin: 11.8 — ABNORMAL LOW
MCHC: 34
MCHC: 33.5
MCV: 93.2
MCV: 93.4
MCV: 94
MCV: 93.4
Platelets: 301
Platelets: 359
Platelets: 416 — ABNORMAL HIGH
RBC: 3.65 — ABNORMAL LOW
RBC: 3.72 — ABNORMAL LOW
RBC: 4.37
RBC: 4.47
RDW: 12.6
WBC: 7.5
WBC: 7.5
WBC: 8.8

## 2011-06-22 LAB — COMPREHENSIVE METABOLIC PANEL
AST: 23
Albumin: 3.5
Chloride: 105
Creatinine, Ser: 1.07
GFR calc Af Amer: >60
Potassium: 3.7
Total Bilirubin: 0.6
Total Protein: 6.7

## 2011-06-22 LAB — URINALYSIS, ROUTINE W REFLEX MICROSCOPIC
Bilirubin Urine: NEGATIVE
Hgb urine dipstick: NEGATIVE
Nitrite: NEGATIVE
Nitrite: NEGATIVE
Specific Gravity, Urine: 1.02
Specific Gravity, Urine: 1.01
pH: 6.5
pH: 5.5

## 2011-06-22 LAB — BASIC METABOLIC PANEL
BUN: 13
CO2: 26
CO2: 28
Calcium: 9
Chloride: 109
Chloride: 99
Chloride: 104
Creatinine, Ser: 1.1
Creatinine, Ser: 0.93
GFR calc Af Amer: 58 — ABNORMAL LOW
GFR calc Af Amer: >60
GFR calc Af Amer: >60
GFR calc non Af Amer: 50 — ABNORMAL LOW
GFR calc non Af Amer: 51 — ABNORMAL LOW
Glucose, Bld: 104 — ABNORMAL HIGH
Glucose, Bld: 91
Potassium: 4
Potassium: 4.3
Sodium: 135
Sodium: 139

## 2011-06-22 LAB — URINE MICROSCOPIC-ADD ON

## 2011-06-22 LAB — CULTURE, BLOOD (ROUTINE X 2)
Report Status: 7312009
Report Status: 7312009

## 2011-06-22 LAB — VANCOMYCIN, TROUGH: Vancomycin Tr: 18.1

## 2011-06-22 LAB — URINE CULTURE: Colony Count: NO GROWTH

## 2011-06-22 LAB — POCT CARDIAC MARKERS: Operator id: 213931

## 2011-06-22 LAB — CARDIAC PANEL(CRET KIN+CKTOT+MB+TROPI): Total CK: 273 — ABNORMAL HIGH

## 2011-06-22 LAB — TSH: TSH: 0.385

## 2012-01-29 ENCOUNTER — Other Ambulatory Visit: Payer: Self-pay

## 2012-01-29 ENCOUNTER — Encounter (HOSPITAL_COMMUNITY): Payer: Self-pay

## 2012-01-29 ENCOUNTER — Emergency Department (HOSPITAL_COMMUNITY)
Admission: EM | Admit: 2012-01-29 | Discharge: 2012-01-29 | Disposition: A | Discharge status: Home / Self Care | Payer: Medicare Other | Attending: Emergency Medicine | Admitting: Emergency Medicine

## 2012-01-29 DIAGNOSIS — N390 Urinary tract infection, site not specified: Secondary | ICD-10-CM

## 2012-01-29 DIAGNOSIS — I459 Conduction disorder, unspecified: Secondary | ICD-10-CM | POA: Insufficient documentation

## 2012-01-29 DIAGNOSIS — R109 Unspecified abdominal pain: Secondary | ICD-10-CM | POA: Insufficient documentation

## 2012-01-29 DIAGNOSIS — R112 Nausea with vomiting, unspecified: Secondary | ICD-10-CM | POA: Insufficient documentation

## 2012-01-29 DIAGNOSIS — E079 Disorder of thyroid, unspecified: Secondary | ICD-10-CM | POA: Insufficient documentation

## 2012-01-29 DIAGNOSIS — R42 Dizziness and giddiness: Secondary | ICD-10-CM | POA: Insufficient documentation

## 2012-01-29 DIAGNOSIS — Z9079 Acquired absence of other genital organ(s): Secondary | ICD-10-CM | POA: Insufficient documentation

## 2012-01-29 DIAGNOSIS — Z859 Personal history of malignant neoplasm, unspecified: Secondary | ICD-10-CM | POA: Insufficient documentation

## 2012-01-29 DIAGNOSIS — I10 Essential (primary) hypertension: Secondary | ICD-10-CM | POA: Insufficient documentation

## 2012-01-29 DIAGNOSIS — R1110 Vomiting, unspecified: Secondary | ICD-10-CM

## 2012-01-29 DIAGNOSIS — R059 Cough, unspecified: Secondary | ICD-10-CM | POA: Insufficient documentation

## 2012-01-29 DIAGNOSIS — R197 Diarrhea, unspecified: Secondary | ICD-10-CM | POA: Insufficient documentation

## 2012-01-29 DIAGNOSIS — R05 Cough: Secondary | ICD-10-CM | POA: Insufficient documentation

## 2012-01-29 HISTORY — DX: Malignant (primary) neoplasm, unspecified: C80.1

## 2012-01-29 HISTORY — DX: Disorder of thyroid, unspecified: E07.9

## 2012-01-29 HISTORY — DX: Essential (primary) hypertension: I10

## 2012-01-29 LAB — CBC
HCT: 41.5 % (ref 36.0–46.0)
Hemoglobin: 13.8 g/dL (ref 12.0–15.0)
MCH: 31 pg (ref 26.0–34.0)
MCV: 93.3 fL (ref 78.0–100.0)
RBC: 4.45 MIL/uL (ref 3.87–5.11)

## 2012-01-29 LAB — COMPREHENSIVE METABOLIC PANEL
ALT: 9 U/L (ref 0–35)
AST: 16 U/L (ref 0–37)
Albumin: 3.7 g/dL (ref 3.5–5.2)
CO2: 26 mEq/L (ref 19–32)
Chloride: 99 mEq/L (ref 96–112)
GFR calc non Af Amer: 55 mL/min — ABNORMAL LOW (ref >90)
Sodium: 135 mEq/L (ref 135–145)
Total Bilirubin: 0.4 mg/dL (ref 0.3–1.2)

## 2012-01-29 LAB — URINE MICROSCOPIC-ADD ON

## 2012-01-29 LAB — URINALYSIS, ROUTINE W REFLEX MICROSCOPIC
Glucose, UA: NEGATIVE mg/dL
Hgb urine dipstick: NEGATIVE
Ketones, ur: NEGATIVE mg/dL
Protein, ur: NEGATIVE mg/dL
pH: 6.5 (ref 5.0–8.0)

## 2012-01-29 LAB — DIFFERENTIAL
Basophils Absolute: 0 10*3/uL (ref 0.0–0.1)
Basophils Relative: 0 % (ref 0–1)
Lymphs Abs: 1.2 10*3/uL (ref 0.7–4.0)

## 2012-01-29 MED ORDER — ONDANSETRON HCL 4 MG/2ML IJ SOLN
4.0000 mg | Freq: Once | INTRAMUSCULAR | Status: AC
  Administered 2012-01-29: 4 mg via INTRAVENOUS
  Filled 2012-01-29: qty 2

## 2012-01-29 MED ORDER — SODIUM CHLORIDE 0.9 % IV BOLUS (SEPSIS)
500.0000 mL | Freq: Once | INTRAVENOUS | Status: AC
  Administered 2012-01-29: 1000 mL via INTRAVENOUS

## 2012-01-29 MED ORDER — CIPROFLOXACIN HCL 250 MG PO TABS: 250.0000 mg | ORAL_TABLET | Freq: Two times a day (BID) | ORAL | Status: AC

## 2012-01-29 MED ORDER — CIPROFLOXACIN HCL 250 MG PO TABS
500.0000 mg | ORAL_TABLET | Freq: Once | ORAL | Status: AC
  Administered 2012-01-29: 500 mg via ORAL
  Filled 2012-01-29: qty 2

## 2012-01-29 NOTE — ED Notes (Signed)
Complain of nausea and vomiting. States the last time she vomited was last night. Denies fever or diarrhea

## 2012-01-29 NOTE — ED Notes (Signed)
Pt ambulated in hall without difficulty. No weakness or dizziness, no complaints.

## 2012-01-29 NOTE — ED Provider Notes (Signed)
History   This chart was scribed for Carolyn Gaskins, MD by Clarita Crane. The patient was seen in room [facility]. Patient's care was started at 0954.    CSN: [id number]  Arrival date & time 01/29/12  7829   First MD Initiated Contact with Patient 01/29/12 1012      Chief Complaint  Patient presents with  . Nausea     HPI Carolyn Hughes is a 78 y.o. female who presents to the Emergency Department complaining of constant moderate nausea, vomiting and diarrhea onset yesterday and persistent since with associated abdominal pain, dizziness and cough. Patient reports having 2 episodes of vomiting yesterday but has had none today and has had 3 episodes of diarrhea described as loose this morning. Denies hematemesis, hematochezia, chest pain, syncope, melena. Patient was evaluated by PCP-[facility] 1 week ago for similar symptoms and was advised symptoms were most likely associated with a viral illness. Patient with h/o CA, HTN, thyroid disease, abdominal hysterectomy  PCP- Margo Aye  Past Medical History  Diagnosis Date  . Cancer   . Hypertension   . Thyroid disease     Past Surgical History  Procedure Date  . Abdominal hysterectomy   . Kidney surgery     No family history on file.  History  Substance Use Topics  . Smoking status: Not on file  . Smokeless tobacco: Not on file  . Alcohol Use: No    OB History    Grav Para Term Preterm Abortions TAB SAB Ect Mult Living                  Review of Systems A complete 10 system review of systems was obtained and all systems are negative except as noted in the HPI and PMH.   Allergies  Penicillins and Iohexol  Home Medications  No current outpatient prescriptions on file.  BP 149/73  Pulse 89  Temp(Src) 97.1 F (36.2 C) (Oral)  Resp 20  SpO2 95%  Physical Exam CONSTITUTIONAL: Well developed/well nourished HEAD AND FACE: Normocephalic/atraumatic EYES: EOMI/PERRL, no scleral icterus ENMT: Mucous membranes moist NECK:  supple no meningeal signs SPINE:entire spine nontender CV: S1/S2 noted, no murmurs/rubs/gallops noted LUNGS: Lungs are clear to auscultation bilaterally, no apparent distress ABDOMEN: soft, nontender, no rebound or guarding, +BS GU:no cva tenderness NEURO: Pt is awake/alert, moves all extremitiesx4 EXTREMITIES: pulses normal, full ROM SKIN: warm, color normal PSYCH: no abnormalities of mood noted  ED Course  Procedures   DIAGNOSTIC STUDIES: Oxygen Saturation is 95% on room air, adequate by my interpretation.    COORDINATION OF CARE: 10:39AM-Patient informed of current plan for treatment and evaluation and agrees with plan at this time.  11:56AM- Patient informed of current labs and intent to d/c home. Patient advised of reasons to return to ED and acknowledges. Pt well appearing, no further complaints, ambulatory, no pain complaints Will tx for uti H/o PCN anaphylaxis, will place on low dose cipro for presumed uti Advised f/u with PCP in 7 days Pt agreeable  The patient appears reasonably screened and/or stabilized for discharge and I doubt any other medical condition or other [facility] requiring further screening, evaluation, or treatment in the ED at this time prior to discharge.   Results for orders placed during the hospital encounter of 01/29/12  CBC      Component Value Range   WBC 9.4  4.0 - 10.5 (K/uL)   RBC 4.45  3.87 - 5.11 (MIL/uL)   Hemoglobin 13.8  12.0 - 15.0 (g/dL)  HCT 41.5  36.0 - 46.0 (%)   MCV 93.3  78.0 - 100.0 (fL)   MCH 31.0  26.0 - 34.0 (pg)   MCHC 33.3  30.0 - 36.0 (g/dL)   RDW 40.9  81.1 - 91.4 (%)   Platelets 339  150 - 400 (K/uL)  DIFFERENTIAL      Component Value Range   Neutrophils Relative 77  43 - 77 (%)   Neutro Abs 7.2  1.7 - 7.7 (K/uL)   Lymphocytes Relative 13  12 - 46 (%)   Lymphs Abs 1.2  0.7 - 4.0 (K/uL)   Monocytes Relative 8  3 - 12 (%)   Monocytes Absolute 0.8  0.1 - 1.0 (K/uL)   Eosinophils Relative 2  0 - 5 (%)   Eosinophils  Absolute 0.2  0.0 - 0.7 (K/uL)   Basophils Relative 0  0 - 1 (%)   Basophils Absolute 0.0  0.0 - 0.1 (K/uL)  COMPREHENSIVE METABOLIC PANEL      Component Value Range   Sodium 135  135 - 145 (mEq/L)   Potassium 3.7  3.5 - 5.1 (mEq/L)   Chloride 99  96 - 112 (mEq/L)   CO2 26  19 - 32 (mEq/L)   Glucose, Bld 107 (*) 70 - 99 (mg/dL)   BUN 14  6 - 23 (mg/dL)   Creatinine, Ser 7.82  0.50 - 1.10 (mg/dL)   Calcium 9.6  8.4 - 95.6 (mg/dL)   Total Protein 7.5  6.0 - 8.3 (g/dL)   Albumin 3.7  3.5 - 5.2 (g/dL)   AST 16  0 - 37 (U/L)   ALT 9  0 - 35 (U/L)   Alkaline Phosphatase 51  39 - 117 (U/L)   Total Bilirubin 0.4  0.3 - 1.2 (mg/dL)   GFR calc non Af Amer 55 (*) >90 (mL/min)   GFR calc Af Amer 64 (*) >90 (mL/min)  LIPASE, BLOOD      Component Value Range   Lipase 34  11 - 59 (U/L)  URINALYSIS, ROUTINE W REFLEX MICROSCOPIC      Component Value Range   Color, Urine YELLOW  YELLOW    APPearance CLEAR  CLEAR    Specific Gravity, Urine 1.005  1.005 - 1.030    pH 6.5  5.0 - 8.0    Glucose, UA NEGATIVE  NEGATIVE (mg/dL)   Hgb urine dipstick NEGATIVE  NEGATIVE    Bilirubin Urine NEGATIVE  NEGATIVE    Ketones, ur NEGATIVE  NEGATIVE (mg/dL)   Protein, ur NEGATIVE  NEGATIVE (mg/dL)   Urobilinogen, UA 0.2  0.0 - 1.0 (mg/dL)   Nitrite NEGATIVE  NEGATIVE    Leukocytes, UA LARGE (*) NEGATIVE   URINE MICROSCOPIC-ADD ON      Component Value Range   Squamous Epithelial / LPF FEW (*) RARE    WBC, UA TOO NUMEROUS TO COUNT  <3 (WBC/hpf)   RBC / HPF 0-2  <3 (RBC/hpf)   Bacteria, UA FEW (*) RARE        MDM  Nursing notes reviewed and considered in documentation All labs/vitals reviewed and considered Previous records reviewed and considered     Date: 01/29/2012  Rate: 87  Rhythm: normal sinus rhythm  QRS Axis: normal  Intervals: normal  ST/T Wave abnormalities: nonspecific ST changes  Conduction Disutrbances:nonspecific intraventricular conduction delay  artifact noted     I  personally performed the services described in this documentation, which was scribed in my presence. The recorded information has been reviewed  and considered.      Carolyn Gaskins, MD 01/29/12 [phone]

## 2012-01-30 LAB — URINE CULTURE: Colony Count: NO GROWTH

## 2012-05-08 ENCOUNTER — Other Ambulatory Visit (HOSPITAL_COMMUNITY): Payer: Self-pay | Admitting: *Deleted

## 2012-05-08 ENCOUNTER — Ambulatory Visit (HOSPITAL_COMMUNITY)
Admission: RE | Admit: 2012-05-08 | Discharge: 2012-05-08 | Disposition: A | Discharge status: Home / Self Care | Payer: Medicare Other | Source: Ambulatory Visit | Attending: *Deleted | Admitting: *Deleted

## 2012-05-08 DIAGNOSIS — R05 Cough: Secondary | ICD-10-CM

## 2012-05-08 DIAGNOSIS — R059 Cough, unspecified: Secondary | ICD-10-CM | POA: Insufficient documentation

## 2012-05-08 DIAGNOSIS — R0989 Other specified symptoms and signs involving the circulatory and respiratory systems: Secondary | ICD-10-CM | POA: Insufficient documentation

## 2012-05-08 DIAGNOSIS — R911 Solitary pulmonary nodule: Secondary | ICD-10-CM | POA: Insufficient documentation

## 2012-08-01 ENCOUNTER — Emergency Department (HOSPITAL_COMMUNITY): Payer: Medicare Other

## 2012-08-01 ENCOUNTER — Inpatient Hospital Stay (HOSPITAL_COMMUNITY)
Admission: EM | Admit: 2012-08-01 | Discharge: 2012-08-05 | DRG: 312 | Disposition: A | Discharge status: Home Health | Payer: Medicare Other | Attending: Internal Medicine | Admitting: Internal Medicine

## 2012-08-01 ENCOUNTER — Encounter (HOSPITAL_COMMUNITY): Payer: Self-pay

## 2012-08-01 DIAGNOSIS — I10 Essential (primary) hypertension: Secondary | ICD-10-CM

## 2012-08-01 DIAGNOSIS — N289 Disorder of kidney and ureter, unspecified: Secondary | ICD-10-CM | POA: Diagnosis present

## 2012-08-01 DIAGNOSIS — Z7982 Long term (current) use of aspirin: Secondary | ICD-10-CM

## 2012-08-01 DIAGNOSIS — S9031XA Contusion of right foot, initial encounter: Secondary | ICD-10-CM

## 2012-08-01 DIAGNOSIS — I422 Other hypertrophic cardiomyopathy: Secondary | ICD-10-CM

## 2012-08-01 DIAGNOSIS — J984 Other disorders of lung: Secondary | ICD-10-CM

## 2012-08-01 DIAGNOSIS — R001 Bradycardia, unspecified: Secondary | ICD-10-CM

## 2012-08-01 DIAGNOSIS — Z85528 Personal history of other malignant neoplasm of kidney: Secondary | ICD-10-CM

## 2012-08-01 DIAGNOSIS — E860 Dehydration: Secondary | ICD-10-CM

## 2012-08-01 DIAGNOSIS — Z79899 Other long term (current) drug therapy: Secondary | ICD-10-CM

## 2012-08-01 DIAGNOSIS — Z90710 Acquired absence of both cervix and uterus: Secondary | ICD-10-CM

## 2012-08-01 DIAGNOSIS — Y92009 Unspecified place in unspecified non-institutional (private) residence as the place of occurrence of the external cause: Secondary | ICD-10-CM

## 2012-08-01 DIAGNOSIS — E785 Hyperlipidemia, unspecified: Secondary | ICD-10-CM

## 2012-08-01 DIAGNOSIS — N183 Chronic kidney disease, stage 3 unspecified: Secondary | ICD-10-CM

## 2012-08-01 DIAGNOSIS — I498 Other specified cardiac arrhythmias: Secondary | ICD-10-CM | POA: Diagnosis present

## 2012-08-01 DIAGNOSIS — I129 Hypertensive chronic kidney disease with stage 1 through stage 4 chronic kidney disease, or unspecified chronic kidney disease: Secondary | ICD-10-CM | POA: Diagnosis present

## 2012-08-01 DIAGNOSIS — D649 Anemia, unspecified: Secondary | ICD-10-CM

## 2012-08-01 DIAGNOSIS — Z8543 Personal history of malignant neoplasm of ovary: Secondary | ICD-10-CM

## 2012-08-01 DIAGNOSIS — S7000XA Contusion of unspecified hip, initial encounter: Secondary | ICD-10-CM | POA: Diagnosis present

## 2012-08-01 DIAGNOSIS — C569 Malignant neoplasm of unspecified ovary: Secondary | ICD-10-CM

## 2012-08-01 DIAGNOSIS — I421 Obstructive hypertrophic cardiomyopathy: Secondary | ICD-10-CM

## 2012-08-01 DIAGNOSIS — E039 Hypothyroidism, unspecified: Secondary | ICD-10-CM

## 2012-08-01 DIAGNOSIS — W19XXXA Unspecified fall, initial encounter: Secondary | ICD-10-CM | POA: Diagnosis present

## 2012-08-01 DIAGNOSIS — M6282 Rhabdomyolysis: Secondary | ICD-10-CM

## 2012-08-01 DIAGNOSIS — R748 Abnormal levels of other serum enzymes: Secondary | ICD-10-CM

## 2012-08-01 DIAGNOSIS — S7001XA Contusion of right hip, initial encounter: Secondary | ICD-10-CM

## 2012-08-01 DIAGNOSIS — R55 Syncope and collapse: Principal | ICD-10-CM

## 2012-08-01 DIAGNOSIS — S9030XA Contusion of unspecified foot, initial encounter: Secondary | ICD-10-CM | POA: Diagnosis present

## 2012-08-01 HISTORY — DX: Syncope and collapse: R55

## 2012-08-01 HISTORY — DX: Anemia, unspecified: D64.9

## 2012-08-01 HISTORY — DX: Obstructive hypertrophic cardiomyopathy: I42.1

## 2012-08-01 HISTORY — DX: Chronic kidney disease, stage 3 (moderate): N18.3

## 2012-08-01 HISTORY — DX: Chronic kidney disease, stage 3 unspecified: N18.30

## 2012-08-01 LAB — CBC WITH DIFFERENTIAL/PLATELET
Eosinophils Absolute: 0.1 10*3/uL (ref 0.0–0.7)
Eosinophils Relative: 1 % (ref 0–5)
HCT: 39.6 % (ref 36.0–46.0)
Lymphocytes Relative: 15 % (ref 12–46)
Lymphs Abs: 1.2 10*3/uL (ref 0.7–4.0)
MCH: 30.9 pg (ref 26.0–34.0)
MCV: 91.9 fL (ref 78.0–100.0)
Monocytes Absolute: 0.8 10*3/uL (ref 0.1–1.0)
Monocytes Relative: 10 % (ref 3–12)
RBC: 4.31 MIL/uL (ref 3.87–5.11)
WBC: 8 10*3/uL (ref 4.0–10.5)

## 2012-08-01 LAB — CBC
HCT: 37.6 % (ref 36.0–46.0)
MCH: 30.7 pg (ref 26.0–34.0)
MCV: 91.7 fL (ref 78.0–100.0)
Platelets: 302 10*3/uL (ref 150–400)
RBC: 4.1 MIL/uL (ref 3.87–5.11)
RDW: 13.5 % (ref 11.5–15.5)

## 2012-08-01 LAB — COMPREHENSIVE METABOLIC PANEL
ALT: 14 U/L (ref 0–35)
BUN: 21 mg/dL (ref 6–23)
CO2: 28 mEq/L (ref 19–32)
Calcium: 8.9 mg/dL (ref 8.4–10.5)
Creatinine, Ser: 1.3 mg/dL — ABNORMAL HIGH (ref 0.50–1.10)
GFR calc Af Amer: 43 mL/min — ABNORMAL LOW (ref >90)
GFR calc non Af Amer: 37 mL/min — ABNORMAL LOW (ref >90)
Glucose, Bld: 103 mg/dL — ABNORMAL HIGH (ref 70–99)
Total Protein: 7 g/dL (ref 6.0–8.3)

## 2012-08-01 LAB — URINE MICROSCOPIC-ADD ON: Urine-Other: NONE SEEN

## 2012-08-01 LAB — URINALYSIS, ROUTINE W REFLEX MICROSCOPIC
Glucose, UA: NEGATIVE mg/dL
Ketones, ur: NEGATIVE mg/dL
Leukocytes, UA: NEGATIVE
Protein, ur: NEGATIVE mg/dL
Urobilinogen, UA: 0.2 mg/dL (ref 0.0–1.0)

## 2012-08-01 LAB — D-DIMER, QUANTITATIVE: D-Dimer, Quant: 0.61 ug/mL-FEU — ABNORMAL HIGH (ref 0.00–0.48)

## 2012-08-01 LAB — TROPONIN I
Troponin I: <0.3 ng/mL (ref <0.30)
Troponin I: <0.3 ng/mL (ref <0.30)

## 2012-08-01 MED ORDER — ASPIRIN EC 81 MG PO TBEC
81.0000 mg | DELAYED_RELEASE_TABLET | Freq: Every morning | ORAL | Status: DC
  Administered 2012-08-02 – 2012-08-05 (×4): 81 mg via ORAL
  Filled 2012-08-01 (×4): qty 1

## 2012-08-01 MED ORDER — ENOXAPARIN SODIUM 40 MG/0.4ML ~~LOC~~ SOLN
40.0000 mg | SUBCUTANEOUS | Status: DC
  Administered 2012-08-01 – 2012-08-04 (×4): 40 mg via SUBCUTANEOUS
  Filled 2012-08-01 (×3): qty 0.4

## 2012-08-01 MED ORDER — LEVOTHYROXINE SODIUM 88 MCG PO TABS
88.0000 ug | ORAL_TABLET | Freq: Every morning | ORAL | Status: DC
  Administered 2012-08-02 – 2012-08-05 (×4): 88 ug via ORAL
  Filled 2012-08-01 (×5): qty 1

## 2012-08-01 MED ORDER — ALBUTEROL SULFATE (5 MG/ML) 0.5% IN NEBU
2.5000 mg | INHALATION_SOLUTION | Freq: Once | RESPIRATORY_TRACT | Status: AC
  Administered 2012-08-01: 2.5 mg via RESPIRATORY_TRACT
  Filled 2012-08-01: qty 0.5

## 2012-08-01 MED ORDER — ACETAMINOPHEN 325 MG PO TABS
650.0000 mg | ORAL_TABLET | Freq: Four times a day (QID) | ORAL | Status: DC | PRN
  Administered 2012-08-01 – 2012-08-03 (×3): 650 mg via ORAL
  Filled 2012-08-01 (×4): qty 2

## 2012-08-01 MED ORDER — IPRATROPIUM BROMIDE 0.02 % IN SOLN
0.5000 mg | Freq: Once | RESPIRATORY_TRACT | Status: AC
  Administered 2012-08-01: 0.5 mg via RESPIRATORY_TRACT
  Filled 2012-08-01: qty 2.5

## 2012-08-01 MED ORDER — ACETAMINOPHEN 650 MG RE SUPP: 650.0000 mg | Freq: Four times a day (QID) | RECTAL | Status: DC | PRN

## 2012-08-01 MED ORDER — SODIUM CHLORIDE 0.9 % IJ SOLN
3.0000 mL | Freq: Two times a day (BID) | INTRAMUSCULAR | Status: DC
  Administered 2012-08-04 – 2012-08-05 (×2): 3 mL via INTRAVENOUS
  Filled 2012-08-01 (×3): qty 3

## 2012-08-01 MED ORDER — ALBUTEROL SULFATE (5 MG/ML) 0.5% IN NEBU
2.5000 mg | INHALATION_SOLUTION | RESPIRATORY_TRACT | Status: DC | PRN
  Administered 2012-08-03 – 2012-08-04 (×2): 2.5 mg via RESPIRATORY_TRACT
  Filled 2012-08-01 (×2): qty 0.5

## 2012-08-01 MED ORDER — ONDANSETRON HCL 4 MG/2ML IJ SOLN
4.0000 mg | Freq: Four times a day (QID) | INTRAMUSCULAR | Status: DC | PRN
  Administered 2012-08-02 – 2012-08-04 (×3): 4 mg via INTRAVENOUS
  Filled 2012-08-01 (×3): qty 2

## 2012-08-01 MED ORDER — POTASSIUM CHLORIDE IN NACL 20-0.9 MEQ/L-% IV SOLN
INTRAVENOUS | Status: DC
  Administered 2012-08-01 – 2012-08-02 (×2): via INTRAVENOUS
  Administered 2012-08-02: 20 mL/h via INTRAVENOUS

## 2012-08-01 MED ORDER — SODIUM CHLORIDE 0.9 % IV SOLN
INTRAVENOUS | Status: AC
  Administered 2012-08-01: 15:00:00 via INTRAVENOUS

## 2012-08-01 MED ORDER — ONDANSETRON HCL 4 MG PO TABS: 4.0000 mg | ORAL_TABLET | Freq: Four times a day (QID) | ORAL | Status: DC | PRN

## 2012-08-01 NOTE — H&P (Signed)
[facility] History and Physical  Carolyn Hughes:[id number] DOB: 09/09/1929 DOA: 08/01/2012  Referring physician: Dr. Preston Fleeting PCP: Dwana Melena, MD  Specialists:   Chief Complaint: syncope  HPI: Carolyn Hughes is a 78 y.o. female who was brought to the hospital after having a syncopal episode. Patient was at home when she went to the door to check on her neighbor's dog.  The next thing she remembers is waking up on the floor.  She was found by her neighbor. She said she did feel a little dizzy prior to passing out.  She does not think she hit her head.  No jerking movements were described in reports, and she did not have any tongue biting or bladder/bowel incontinence.  It did not appear she was post ictal after the event.  She is unsure as to how long she had passed out, but thinks it may be close to 30 mins. She has not had any chest pain or shortness of breath.  She has not had any fever, nausea, vomiting, or diarrhea.  She reports that her po intake has been adequate.  She has noted some wheezing and feels this may be related to an upper resp tract infection.   Review of Systems: Pertinent positives as per HPI, otherwise negative  Past Medical History  Diagnosis Date  . Cancer   . Hypertension   . Thyroid disease    Past Surgical History  Procedure Date  . Abdominal hysterectomy   . Kidney surgery    Social History:  reports that she has never smoked. She does not have any smokeless tobacco history on file. She reports that she does not drink alcohol or use illicit drugs. Patient lives independantly  Allergies  Allergen Reactions  . Penicillins Anaphylaxis  . Iohexol Hives and Rash    Family History: mother died of stomach cancer, father died of stroke   Prior to Admission medications   Medication Sig Start Date End Date Taking? Authorizing Provider  aspirin EC 81 MG tablet Take 81 mg by mouth every morning.   Yes Historical Provider, MD  calcium carbonate (OS-CAL)  600 MG TABS Take 600 mg by mouth 2 (two) times daily with a meal.   Yes Historical Provider, MD  irbesartan-hydrochlorothiazide (AVALIDE) 150-12.5 MG per tablet Take 1 tablet by mouth every morning.   Yes Historical Provider, MD  levothyroxine (SYNTHROID, LEVOTHROID) 88 MCG tablet Take 88 mcg by mouth every morning.   Yes Historical Provider, MD   Physical Exam: Filed Vitals:   08/01/12 1010 08/01/12 1304 08/01/12 1442 08/01/12 1457  BP: 142/83 113/52 121/50 107/66  Pulse: 63 73 90 78  Temp:   98.3 F (36.8 C) 97.8 F (36.6 C)  TempSrc:   Oral Oral  Resp: 14 16 18 18   Height:    5\' 3"  (1.6 m)  Weight:    73.5 kg (162 lb 0.6 oz)  SpO2: 98% 94% 95% 95%     General:  NAD  Eyes: PERRLA  ENT: no pharyngeal erythema  Neck: supple  Cardiovascular: s1, s2, rrr  Respiratory: cta b  Abdomen: soft, nt, bs+  Skin: normal  Musculoskeletal: deferred  Psychiatric: normal affect cooperative with exam  Neurologic: grossly intact, non focal  Labs on Admission:  Basic Metabolic Panel:  Lab 08/01/12 2536  NA 136  K 4.2  CL 98  CO2 28  GLUCOSE 103*  BUN 21  CREATININE 1.30*  CALCIUM 8.9  MG --  PHOS --   Liver Function  Tests:  Lab 08/01/12 0906  AST 28  ALT 14  ALKPHOS 54  BILITOT 0.6  PROT 7.0  ALBUMIN 3.4*   No results found for this basename: LIPASE:5,AMYLASE:5 in the last 168 hours No results found for this basename: AMMONIA:5 in the last 168 hours CBC:  Lab 08/01/12 0906  WBC 8.0  NEUTROABS 5.8  HGB 13.3  HCT 39.6  MCV 91.9  PLT 304   Cardiac Enzymes:  Lab 08/01/12 0906  CKTOTAL 789*  CKMB --  CKMBINDEX --  TROPONINI <0.30    BNP (last 3 results) No results found for this basename: PROBNP:3 in the last 8760 hours CBG: No results found for this basename: GLUCAP:5 in the last 168 hours  Radiological Exams on Admission: Dg Chest 2 View  08/01/2012  *RADIOLOGY REPORT*  Clinical Data: Fall, loss of consciousness, history hypertension, renal  cancer, ovarian cancer  CHEST - 2 VIEW  Comparison: 05/08/2012 Correlation:  CT chest 05/25/2010  Findings: Upper normal heart size. Calcified tortuous aorta. Pulmonary vascularity normal. Mild chronic left basilar atelectasis. Underlying emphysematous and chronic bronchitic changes. Chronic increase in medial right lower lobe markings stable. No definite acute infiltrate or pleural effusion. No pneumothorax. 12 mm nodular density mid right lung, roughly corresponding in position with a 9 mm diameter nodule seen on prior CT. No acute osseous findings. Bones appear diffusely demineralized.  IMPRESSION: Changes of COPD with chronic bibasilar atelectasis versus scarring. 12 mm nodular density lower right lung, unchanged since 05/08/2012 and roughly corresponding to a known right lung base nodule which measured 9 mm by CT. This likely represents a stable nodule when accounting for magnification inherent to radiography. No definite acute process identified.   Original Report Authenticated By: Ulyses Southward, M.D.    Dg Hip Complete Right  08/01/2012  *RADIOLOGY REPORT*  Clinical Data: Right hip pain post fall, bruising, tenderness  RIGHT HIP - COMPLETE 2+ VIEW  Comparison: None  Findings: Osseous demineralization. Symmetric hip and SI joints. Scattered atherosclerotic calcifications. Degenerative disc disease changes lower lumbar spine. No acute fracture, dislocation or bone destruction.  IMPRESSION: No acute osseous abnormalities.   Original Report Authenticated By: Ulyses Southward, M.D.    Ct Head Wo Contrast  08/01/2012  *RADIOLOGY REPORT*  Clinical Data: Fall, loss of consciousness  CT HEAD WITHOUT CONTRAST  Technique:  Contiguous axial images were obtained from the base of the skull through the vertex without contrast.  Comparison: Brain MRI 07/19/2009  Findings: No intracranial hemorrhage.  No parenchymal contusion. No midline shift or mass effect.  Basilar cisterns are patent. No skull base fracture.  No fluid in the  paranasal sinuses or mastoid air cells.  There is generalized cortical atrophy and ventricular dilatation unchanged.  There is deep white matter hypodensities in the internal and external capsules which also unchanged.  IMPRESSION:  1.  No intracranial trauma. 2.  Microvascular disease and atrophy unchanged from comparison MRI.   Original Report Authenticated By: Genevive Bi, M.D.    Dg Foot Complete Right  08/01/2012  *RADIOLOGY REPORT*  Clinical Data: Fall, loss of consciousness.  Pain, swelling.  RIGHT FOOT COMPLETE - 3+ VIEW  Comparison: None.  Findings: Mild degenerative changes at the first MTP joint.  Soft tissue swelling over the dorsum of the foot.  No acute bony abnormality.  No acute fracture, subluxation or dislocation.  IMPRESSION: No acute bony abnormality.   Original Report Authenticated By: Charlett Nose, M.D.     EKG: Independently reviewed. Sinus rhythm, RBBB, no acute  changes  Assessment/Plan Active Problems:  HYPERTENSION  Syncope and collapse  Hypertrophic cardiomyopathy  Hypothyroid  Rhabdomyolysis  CKD (chronic kidney disease) stage 3, GFR 30-59 ml/min  Dehydration   1. Syncope and collapse.  Possibly related to dehydration and hypertrophic cardiomyopathy.  Will monitor patient on telemetry to evaluate for any arrhythmias. Will cycle cardiac markers and D dimer. Will provide hydration.  Check TSH. 2. Mild Rhabdomyolysis.  Recheck ck in am after hydration 3. Dehydration, will give IV fluids 4. Hypothyroidism.  Continue levothyroxine.  Code Status: Limited Code, she would not want CPR, but would want to be intubated temporarily Family Communication: Discussed with patient, no family present Disposition Plan: possible discharge tomorrow if work up unremarkable  Time spent:  MEMON,JEHANZEB [facility] Pager [phone]  If 7PM-7AM, please contact night-coverage [email or web address] Password [email or web address] 08/01/2012, 4:28 PM      \

## 2012-08-01 NOTE — ED Notes (Signed)
Pt "blacked out" last night and fell, laid outside on porch for 3 hours.   Was found by neighbors and ems was called but did not come to er for eval.

## 2012-08-01 NOTE — ED Provider Notes (Addendum)
History    This chart was scribed for Dione Booze, MD, MD by Smitty Pluck, ED Scribe. The patient was seen in room APA12 and the patient's care was started at 8:48AM.   CSN: [id number]  Arrival date & time 08/01/12  4098       Chief Complaint  Patient presents with  . Fall  . Loss of Consciousness    (Consider location/radiation/quality/duration/timing/severity/associated sxs/prior treatment) The history is provided by the patient. No language interpreter was used.   Carolyn Hughes is a 78 y.o. female with hx of cancer, HTN and thyroid disease who presents to the Emergency Department complaining of syncope and fall onset 1 day ago around 8PM. She reports constant, mild, right foot pain that is rated at 3/10 currently and mild pain in her right hip. Denies head injury. Pt denies any warning symptoms before syncope. She reports that she went outside on porch to determine what the dog was barking at then all she remembers is gaining consciousness in the yard. She reports that she was found by her neighbor. She thinks that she was laying in yard for 1 hour. She denies chest pain, nausea, vomiting and any other pain. She states EMS arrived at her house after fall and told her that it was not necessary to come to ED. Pt reports that she has had syncope multiple times in the past and the episode 1 day ago was similar to past episodes. She states that she has been to neurologist at [facility] for syncope but results were unremarkable.   Past Medical History  Diagnosis Date  . Cancer   . Hypertension   . Thyroid disease     Past Surgical History  Procedure Date  . Abdominal hysterectomy   . Kidney surgery     No family history on file.  History  Substance Use Topics  . Smoking status: Not on file  . Smokeless tobacco: Not on file  . Alcohol Use: No    OB History    Grav Para Term Preterm Abortions TAB SAB Ect Mult Living                  Review of Systems  All other systems  reviewed and are negative.  10 Systems reviewed and all are negative for acute change except as noted in the HPI.    Allergies  Penicillins and Iohexol  Home Medications   Current Outpatient Rx  Name  Route  Sig  Dispense  Refill  . ASPIRIN EC 81 MG PO TBEC   Oral   Take 81 mg by mouth every morning.         Marland Kitchen CALCIUM CARBONATE 600 MG PO TABS   Oral   Take 600 mg by mouth 2 (two) times daily with a meal.         . IRBESARTAN-HYDROCHLOROTHIAZIDE 150-12.5 MG PO TABS   Oral   Take 1 tablet by mouth every morning.         Marland Kitchen LEVOTHYROXINE SODIUM 88 MCG PO TABS   Oral   Take 88 mcg by mouth every morning.           BP 142/83  Pulse 63  Temp 97.8 F (36.6 C) (Oral)  Resp 14  SpO2 98%  Physical Exam  Nursing note and vitals reviewed. Constitutional: She is oriented to person, place, and time. She appears well-developed and well-nourished. No distress.  HENT:  Head: Normocephalic and atraumatic.  Eyes: Conjunctivae normal are normal.  Neck: Normal range of motion. Neck supple.  Cardiovascular: Normal rate and regular rhythm.        3/6 systolic crescendo  decrescendo  murmur at cardiac base radiating to neck at cardic apex   Pulmonary/Chest: She has wheezes (anterior bilateral lungs). She has rales (anterior bilateral lungs).  Musculoskeletal:       Moderate tenderness lateral aspect right hip Moderate pain of right hip with ROM  ecchymosis over right first toe and distal mid foot Moderate tenderness over distal metatarsal and distal right 1st toe  Neurological: She is alert and oriented to person, place, and time.  Skin: Skin is warm and dry.  Psychiatric: She has a normal mood and affect. Her behavior is normal.    ED Course  Procedures (including critical care time) DIAGNOSTIC STUDIES: Oxygen Saturation is 100% on room air, normal by my interpretation.    COORDINATION OF CARE: 8:58 AM Discussed ED treatment with pt  9:00 AM Ordered:     .  [COMPLETED] albuterol  2.5 mg Nebulization Once  . [COMPLETED] ipratropium  0.5 mg Nebulization Once      Results for orders placed during the hospital encounter of 08/01/12  CBC WITH DIFFERENTIAL      Component Value Range   WBC 8.0  4.0 - 10.5 K/uL   RBC 4.31  3.87 - 5.11 MIL/uL   Hemoglobin 13.3  12.0 - 15.0 g/dL   HCT 16.1  09.6 - 04.5 %   MCV 91.9  78.0 - 100.0 fL   MCH 30.9  26.0 - 34.0 pg   MCHC 33.6  30.0 - 36.0 g/dL   RDW 40.9  81.1 - 91.4 %   Platelets 304  150 - 400 K/uL   Neutrophils Relative 73  43 - 77 %   Neutro Abs 5.8  1.7 - 7.7 K/uL   Lymphocytes Relative 15  12 - 46 %   Lymphs Abs 1.2  0.7 - 4.0 K/uL   Monocytes Relative 10  3 - 12 %   Monocytes Absolute 0.8  0.1 - 1.0 K/uL   Eosinophils Relative 1  0 - 5 %   Eosinophils Absolute 0.1  0.0 - 0.7 K/uL   Basophils Relative 0  0 - 1 %   Basophils Absolute 0.0  0.0 - 0.1 K/uL  COMPREHENSIVE METABOLIC PANEL      Component Value Range   Sodium 136  135 - 145 mEq/L   Potassium 4.2  3.5 - 5.1 mEq/L   Chloride 98  96 - 112 mEq/L   CO2 28  19 - 32 mEq/L   Glucose, Bld 103 (*) 70 - 99 mg/dL   BUN 21  6 - 23 mg/dL   Creatinine, Ser 7.82 (*) 0.50 - 1.10 mg/dL   Calcium 8.9  8.4 - 95.6 mg/dL   Total Protein 7.0  6.0 - 8.3 g/dL   Albumin 3.4 (*) 3.5 - 5.2 g/dL   AST 28  0 - 37 U/L   ALT 14  0 - 35 U/L   Alkaline Phosphatase 54  39 - 117 U/L   Total Bilirubin 0.6  0.3 - 1.2 mg/dL   GFR calc non Af Amer 37 (*) >90 mL/min   GFR calc Af Amer 43 (*) >90 mL/min  TROPONIN I      Component Value Range   Troponin I <0.30  <0.30 ng/mL  CK      Component Value Range   Total CK 789 (*) 7 - 177 U/L  URINALYSIS, ROUTINE W REFLEX MICROSCOPIC      Component Value Range   Color, Urine YELLOW  YELLOW   APPearance CLEAR  CLEAR   Specific Gravity, Urine 1.010  1.005 - 1.030   pH 6.0  5.0 - 8.0   Glucose, UA NEGATIVE  NEGATIVE mg/dL   Hgb urine dipstick TRACE (*) NEGATIVE   Bilirubin Urine NEGATIVE  NEGATIVE   Ketones, ur  NEGATIVE  NEGATIVE mg/dL   Protein, ur NEGATIVE  NEGATIVE mg/dL   Urobilinogen, UA 0.2  0.0 - 1.0 mg/dL   Nitrite NEGATIVE  NEGATIVE   Leukocytes, UA NEGATIVE  NEGATIVE  URINE MICROSCOPIC-ADD ON      Component Value Range   Urine-Other       Value: NO FORMED ELEMENTS SEEN ON URINE MICROSCOPIC EXAMINATION   Dg Chest 2 View  08/01/2012  *RADIOLOGY REPORT*  Clinical Data: Fall, loss of consciousness, history hypertension, renal cancer, ovarian cancer  CHEST - 2 VIEW  Comparison: 05/08/2012 Correlation:  CT chest 05/25/2010  Findings: Upper normal heart size. Calcified tortuous aorta. Pulmonary vascularity normal. Mild chronic left basilar atelectasis. Underlying emphysematous and chronic bronchitic changes. Chronic increase in medial right lower lobe markings stable. No definite acute infiltrate or pleural effusion. No pneumothorax. 12 mm nodular density mid right lung, roughly corresponding in position with a 9 mm diameter nodule seen on prior CT. No acute osseous findings. Bones appear diffusely demineralized.  IMPRESSION: Changes of COPD with chronic bibasilar atelectasis versus scarring. 12 mm nodular density lower right lung, unchanged since 05/08/2012 and roughly corresponding to a known right lung base nodule which measured 9 mm by CT. This likely represents a stable nodule when accounting for magnification inherent to radiography. No definite acute process identified.   Original Report Authenticated By: Ulyses Southward, M.D.    Dg Hip Complete Right  08/01/2012  *RADIOLOGY REPORT*  Clinical Data: Right hip pain post fall, bruising, tenderness  RIGHT HIP - COMPLETE 2+ VIEW  Comparison: None  Findings: Osseous demineralization. Symmetric hip and SI joints. Scattered atherosclerotic calcifications. Degenerative disc disease changes lower lumbar spine. No acute fracture, dislocation or bone destruction.  IMPRESSION: No acute osseous abnormalities.   Original Report Authenticated By: Ulyses Southward, M.D.     Ct Head Wo Contrast  08/01/2012  *RADIOLOGY REPORT*  Clinical Data: Fall, loss of consciousness  CT HEAD WITHOUT CONTRAST  Technique:  Contiguous axial images were obtained from the base of the skull through the vertex without contrast.  Comparison: Brain MRI 07/19/2009  Findings: No intracranial hemorrhage.  No parenchymal contusion. No midline shift or mass effect.  Basilar cisterns are patent. No skull base fracture.  No fluid in the paranasal sinuses or mastoid air cells.  There is generalized cortical atrophy and ventricular dilatation unchanged.  There is deep white matter hypodensities in the internal and external capsules which also unchanged.  IMPRESSION:  1.  No intracranial trauma. 2.  Microvascular disease and atrophy unchanged from comparison MRI.   Original Report Authenticated By: Genevive Bi, M.D.    Dg Foot Complete Right  08/01/2012  *RADIOLOGY REPORT*  Clinical Data: Fall, loss of consciousness.  Pain, swelling.  RIGHT FOOT COMPLETE - 3+ VIEW  Comparison: None.  Findings: Mild degenerative changes at the first MTP joint.  Soft tissue swelling over the dorsum of the foot.  No acute bony abnormality.  No acute fracture, subluxation or dislocation.  IMPRESSION: No acute bony abnormality.   Original Report Authenticated By: Charlett Nose, M.D.  Date: 08/01/2012  Rate: 71  Rhythm: normal sinus rhythm  QRS Axis: normal  Intervals: normal  ST/T Wave abnormalities: normal  Conduction Disutrbances:right bundle branch block  Narrative Interpretation: Right bundle branch block. When compared with ECG of Jan 29 2012, no significant changes are seen.  Old EKG Reviewed: unchanged    1. Syncope   2. Contusion of hip, right   3. Elevated CK   4. Contusion of foot, right       MDM  Syncope with unknown duration loss of consciousness. Injuries to the right foot and right hip from the fall and x-rays of been ordered. Old charts were reviewed and she has been diagnosed with  hypertrophic cardiomyopathy in the past, and this probably accounts for her repeated syncopal episodes. Chest x-ray will be obtained to evaluate her lungs and she has rales and wheezes. She will also be given albuterol with Atrovent.  Workup is negative for acute injury. However, she does have moderate elevation of CK-MB including some degree of rhabdomyolysis. She also has a slight increase in her creatinine compared with baseline. She has only one kidney, such as she should be observed carefully to make sure that should her renal function is not.. Case is discussed with Dr. Kerry Hough of [facility] who agrees to admit the patient under observation status.  I personally performed the services described in this documentation, which was scribed in my presence. The recorded information has been reviewed and is accurate.      Dione Booze, MD 08/01/12 1233  Dione Booze, MD 08/01/12 [phone]

## 2012-08-02 ENCOUNTER — Encounter (HOSPITAL_COMMUNITY): Payer: Self-pay | Admitting: *Deleted

## 2012-08-02 DIAGNOSIS — R748 Abnormal levels of other serum enzymes: Secondary | ICD-10-CM

## 2012-08-02 DIAGNOSIS — R001 Bradycardia, unspecified: Secondary | ICD-10-CM | POA: Diagnosis present

## 2012-08-02 LAB — BASIC METABOLIC PANEL
BUN: 19 mg/dL (ref 6–23)
Chloride: 107 mEq/L (ref 96–112)
Creatinine, Ser: 1.29 mg/dL — ABNORMAL HIGH (ref 0.50–1.10)
GFR calc Af Amer: 44 mL/min — ABNORMAL LOW (ref >90)
GFR calc non Af Amer: 38 mL/min — ABNORMAL LOW (ref >90)

## 2012-08-02 LAB — CBC
HCT: 34.2 % — ABNORMAL LOW (ref 36.0–46.0)
MCHC: 33.3 g/dL (ref 30.0–36.0)
Platelets: 261 10*3/uL (ref 150–400)
RDW: 13.4 % (ref 11.5–15.5)
WBC: 6 10*3/uL (ref 4.0–10.5)

## 2012-08-02 LAB — TROPONIN I: Troponin I: <0.3 ng/mL (ref <0.30)

## 2012-08-02 LAB — TSH: TSH: 1.499 u[IU]/mL (ref 0.350–4.500)

## 2012-08-02 LAB — CK: Total CK: 465 U/L — ABNORMAL HIGH (ref 7–177)

## 2012-08-02 NOTE — Progress Notes (Signed)
[facility]             Progress Note   Subjective: Patient had episode this morning where she became very lightheaded, nauseous and felt like she was going to pass out.  Telemetry readings at that time showed a bradycardia with a heart rate going into the 40s.    Objective: Vital signs in last 24 hours: Temp:  [96.5 F (35.8 C)-97.5 F (36.4 C)] 97.1 F (36.2 C) (11/09 1439) Pulse Rate:  [57-71] 64  (11/09 1439) Resp:  [16-20] 20  (11/09 1439) BP: (80-149)/(50-83) 133/73 mmHg (11/09 1439) SpO2:  [95 %-97 %] 97 % (11/09 1439) Weight:  [74.6 kg (164 lb 7.4 oz)] 74.6 kg (164 lb 7.4 oz) (11/09 0500) Weight change:  Last BM Date: 08/02/12  Intake/Output from previous day: 11/08 0701 - 11/09 0700 In: 1641.3 [P.O.:740; I.V.:901.3] Out: -  Total I/O In: 600 [P.O.:600] Out: -    Physical Exam: General: Alert, awake, oriented x3, in no acute distress. HEENT: No bruits, no goiter. Heart: Regular rate and rhythm, without murmurs, rubs, gallops. Lungs: Clear to auscultation bilaterally. Abdomen: Soft, nontender, nondistended, positive bowel sounds. Extremities: No clubbing cyanosis or edema with positive pedal pulses. Neuro: Grossly intact, nonfocal.    Lab Results: Basic Metabolic Panel:  Basename 08/02/12 0518 08/01/12 1701 08/01/12 0906  NA 140 -- 136  K 4.7 -- 4.2  CL 107 -- 98  CO2 26 -- 28  GLUCOSE 95 -- 103*  BUN 19 -- 21  CREATININE 1.29* 1.13* --  CALCIUM 8.6 -- 8.9  MG -- -- --  PHOS -- -- --   Liver Function Tests:  [facility] 08/01/12 0906  AST 28  ALT 14  ALKPHOS 54  BILITOT 0.6  PROT 7.0  ALBUMIN 3.4*   No results found for this basename: LIPASE:2,AMYLASE:2 in the last 72 hours No results found for this basename: AMMONIA:2 in the last 72 hours CBC:  Basename 08/02/12 0518 08/01/12 1701 08/01/12 0906  WBC 6.0 7.6 --  NEUTROABS -- -- 5.8  HGB 11.4* 12.6 --  HCT 34.2* 37.6 --  MCV 92.7 91.7 --  PLT 261 302 --   Cardiac  Enzymes:  Basename 08/02/12 0518 08/01/12 2202 08/01/12 1701 08/01/12 0906  CKTOTAL 465* -- -- 789*  CKMB -- -- -- --  CKMBINDEX -- -- -- --  TROPONINI <0.30 <0.30 <0.30 --   BNP: No results found for this basename: PROBNP:3 in the last 72 hours D-Dimer:  [facility] 08/01/12 1701  DDIMER 0.61*   CBG: No results found for this basename: GLUCAP:6 in the last 72 hours Hemoglobin A1C: No results found for this basename: HGBA1C in the last 72 hours Fasting Lipid Panel: No results found for this basename: CHOL,HDL,LDLCALC,TRIG,CHOLHDL,LDLDIRECT in the last 72 hours Thyroid Function Tests:  Basename 08/01/12 1701  TSH 1.499  T4TOTAL --  FREET4 --  T3FREE --  THYROIDAB --   Anemia Panel: No results found for this basename: VITAMINB12,FOLATE,FERRITIN,TIBC,IRON,RETICCTPCT in the last 72 hours Coagulation: No results found for this basename: LABPROT:2,INR:2 in the last 72 hours Urine Drug Screen: Drugs of Abuse  No results found for this basename: labopia, cocainscrnur, labbenz, amphetmu, thcu, labbarb    Alcohol Level: No results found for this basename: ETH:2 in the last 72 hours Urinalysis:  Basename 08/01/12 0948  COLORURINE YELLOW  LABSPEC 1.010  PHURINE 6.0  GLUCOSEU NEGATIVE  HGBUR TRACE*  BILIRUBINUR NEGATIVE  KETONESUR NEGATIVE  PROTEINUR NEGATIVE  UROBILINOGEN 0.2  NITRITE NEGATIVE  LEUKOCYTESUR  NEGATIVE    No results found for this or any previous visit (from the past 240 hour(s)).  Studies/Results: Dg Chest 2 View  08/01/2012  *RADIOLOGY REPORT*  Clinical Data: Fall, loss of consciousness, history hypertension, renal cancer, ovarian cancer  CHEST - 2 VIEW  Comparison: 05/08/2012 Correlation:  CT chest 05/25/2010  Findings: Upper normal heart size. Calcified tortuous aorta. Pulmonary vascularity normal. Mild chronic left basilar atelectasis. Underlying emphysematous and chronic bronchitic changes. Chronic increase in medial right lower lobe markings stable.  No definite acute infiltrate or pleural effusion. No pneumothorax. 12 mm nodular density mid right lung, roughly corresponding in position with a 9 mm diameter nodule seen on prior CT. No acute osseous findings. Bones appear diffusely demineralized.  IMPRESSION: Changes of COPD with chronic bibasilar atelectasis versus scarring. 12 mm nodular density lower right lung, unchanged since 05/08/2012 and roughly corresponding to a known right lung base nodule which measured 9 mm by CT. This likely represents a stable nodule when accounting for magnification inherent to radiography. No definite acute process identified.   Original Report Authenticated By: Ulyses Southward, M.D.    Dg Hip Complete Right  08/01/2012  *RADIOLOGY REPORT*  Clinical Data: Right hip pain post fall, bruising, tenderness  RIGHT HIP - COMPLETE 2+ VIEW  Comparison: None  Findings: Osseous demineralization. Symmetric hip and SI joints. Scattered atherosclerotic calcifications. Degenerative disc disease changes lower lumbar spine. No acute fracture, dislocation or bone destruction.  IMPRESSION: No acute osseous abnormalities.   Original Report Authenticated By: Ulyses Southward, M.D.    Ct Head Wo Contrast  08/01/2012  *RADIOLOGY REPORT*  Clinical Data: Fall, loss of consciousness  CT HEAD WITHOUT CONTRAST  Technique:  Contiguous axial images were obtained from the base of the skull through the vertex without contrast.  Comparison: Brain MRI 07/19/2009  Findings: No intracranial hemorrhage.  No parenchymal contusion. No midline shift or mass effect.  Basilar cisterns are patent. No skull base fracture.  No fluid in the paranasal sinuses or mastoid air cells.  There is generalized cortical atrophy and ventricular dilatation unchanged.  There is deep white matter hypodensities in the internal and external capsules which also unchanged.  IMPRESSION:  1.  No intracranial trauma. 2.  Microvascular disease and atrophy unchanged from comparison MRI.   Original  Report Authenticated By: Genevive Bi, M.D.    Dg Foot Complete Right  08/01/2012  *RADIOLOGY REPORT*  Clinical Data: Fall, loss of consciousness.  Pain, swelling.  RIGHT FOOT COMPLETE - 3+ VIEW  Comparison: None.  Findings: Mild degenerative changes at the first MTP joint.  Soft tissue swelling over the dorsum of the foot.  No acute bony abnormality.  No acute fracture, subluxation or dislocation.  IMPRESSION: No acute bony abnormality.   Original Report Authenticated By: Charlett Nose, M.D.     Medications: Scheduled Meds:   . [COMPLETED] sodium chloride   Intravenous STAT  . aspirin EC  81 mg Oral q morning - 10a  . enoxaparin (LOVENOX) injection  40 mg Subcutaneous Q24H  . levothyroxine  88 mcg Oral q morning - 10a  . sodium chloride  3 mL Intravenous Q12H   Continuous Infusions:   . 0.9 % NaCl with KCl 20 mEq / L 75 mL/hr at 08/02/12 0535   PRN Meds:.acetaminophen, acetaminophen, albuterol, ondansetron (ZOFRAN) IV, ondansetron  Assessment/Plan:  Active Problems:  HYPERTENSION  Syncope and collapse  Hypertrophic cardiomyopathy  Hypothyroid  Rhabdomyolysis  CKD (chronic kidney disease) stage 3, GFR 30-59 ml/min  Dehydration  1. Syncope with bradycardia.  Patient was admitted to the hospital with syncope and collapse.  Her cardiac enzymes have been unremarkable. TSH is also normal.  She had another symptomatic episode in the hospital and telemetry monitoring showed bradycardia into the 40s.  EKG show sinus rhythm with multiple PACs.  ?sick sinus syndrome.  Patient will likely need to be evaluated by cardiology prior to discharge to see if she is a candidate for pacemaker.  Will continue to monitor on telemetry  2. Mild rhabdomyolysis.  Improving with IVF  3. Dehydration, corrected, she is not orthostatic  4. Hypothyroidism.  TSH is normal.  Time spent coordinating care:   LOS: 1 day   Petrea Fredenburg [facility] Pager: [phone] 08/02/2012, 3:38  PM

## 2012-08-02 NOTE — Progress Notes (Signed)
Nurse called to patient's room. Pt states "I just feel puney".  Pt also states she feels weak and dizzy.  Pt's skin pale and warm.  Pt sitting on side of bed.  Assisted in lying down on position. Pt's vital signs are as seen in flow sheets. Dr. Kerry Hough verbally made aware. New verbal order received for STAT EKG. Will continue to monitor.

## 2012-08-03 LAB — GLUCOSE, CAPILLARY: Glucose-Capillary: 147 mg/dL — ABNORMAL HIGH (ref 70–99)

## 2012-08-03 NOTE — Progress Notes (Signed)
[facility]             Progress Note   Subjective: Patient feels better today.  She has not had any further episodes of lightheadedness or dizziness today.  No chest pain or shortness of breath.   Objective: Vital signs in last 24 hours: Temp:  [97.5 F (36.4 C)-98 F (36.7 C)] 97.7 F (36.5 C) (11/10 1445) Pulse Rate:  [55-63] 56  (11/10 1445) Resp:  [20] 20  (11/10 1445) BP: (99-116)/(58-72) 99/62 mmHg (11/10 1445) SpO2:  [95 %-98 %] 96 % (11/10 1445) Weight:  [75.6 kg (166 lb 10.7 oz)] 75.6 kg (166 lb 10.7 oz) (11/10 0726) Weight change: 1.1 kg (2 lb 6.8 oz) Last BM Date: 08/02/12  Intake/Output from previous day: 11/09 0701 - 11/10 0700 In: 840 [P.O.:840] Out: -      Physical Exam: General: Alert, awake, oriented x3, in no acute distress. HEENT: No bruits, no goiter. Heart: Regular rate and rhythm, without murmurs, rubs, gallops. Lungs: Clear to auscultation bilaterally. Abdomen: Soft, nontender, nondistended, positive bowel sounds. Extremities: No clubbing cyanosis or edema with positive pedal pulses. Neuro: Grossly intact, nonfocal.    Lab Results: Basic Metabolic Panel:  Basename 08/02/12 0518 08/01/12 1701 08/01/12 0906  NA 140 -- 136  K 4.7 -- 4.2  CL 107 -- 98  CO2 26 -- 28  GLUCOSE 95 -- 103*  BUN 19 -- 21  CREATININE 1.29* 1.13* --  CALCIUM 8.6 -- 8.9  MG -- -- --  PHOS -- -- --   Liver Function Tests:  [facility] 08/01/12 0906  AST 28  ALT 14  ALKPHOS 54  BILITOT 0.6  PROT 7.0  ALBUMIN 3.4*   No results found for this basename: LIPASE:2,AMYLASE:2 in the last 72 hours No results found for this basename: AMMONIA:2 in the last 72 hours CBC:  Basename 08/02/12 0518 08/01/12 1701 08/01/12 0906  WBC 6.0 7.6 --  NEUTROABS -- -- 5.8  HGB 11.4* 12.6 --  HCT 34.2* 37.6 --  MCV 92.7 91.7 --  PLT 261 302 --   Cardiac Enzymes:  Basename 08/02/12 0518 08/01/12 2202 08/01/12 1701 08/01/12 0906  CKTOTAL 465* -- -- 789*  CKMB  -- -- -- --  CKMBINDEX -- -- -- --  TROPONINI <0.30 <0.30 <0.30 --   BNP: No results found for this basename: PROBNP:3 in the last 72 hours D-Dimer:  [facility] 08/01/12 1701  DDIMER 0.61*   CBG:  Basename 08/03/12 0801  GLUCAP 147*   Hemoglobin A1C: No results found for this basename: HGBA1C in the last 72 hours Fasting Lipid Panel: No results found for this basename: CHOL,HDL,LDLCALC,TRIG,CHOLHDL,LDLDIRECT in the last 72 hours Thyroid Function Tests:  Basename 08/01/12 1701  TSH 1.499  T4TOTAL --  FREET4 --  T3FREE --  THYROIDAB --   Anemia Panel: No results found for this basename: VITAMINB12,FOLATE,FERRITIN,TIBC,IRON,RETICCTPCT in the last 72 hours Coagulation: No results found for this basename: LABPROT:2,INR:2 in the last 72 hours Urine Drug Screen: Drugs of Abuse  No results found for this basename: labopia,  cocainscrnur,  labbenz,  amphetmu,  thcu,  labbarb    Alcohol Level: No results found for this basename: ETH:2 in the last 72 hours Urinalysis:  Basename 08/01/12 0948  COLORURINE YELLOW  LABSPEC 1.010  PHURINE 6.0  GLUCOSEU NEGATIVE  HGBUR TRACE*  BILIRUBINUR NEGATIVE  KETONESUR NEGATIVE  PROTEINUR NEGATIVE  UROBILINOGEN 0.2  NITRITE NEGATIVE  LEUKOCYTESUR NEGATIVE    No results found for this or any previous visit (  from the past 240 hour(s)).  Studies/Results: No results found.  Medications: Scheduled Meds:    . aspirin EC  81 mg Oral q morning - 10a  . enoxaparin (LOVENOX) injection  40 mg Subcutaneous Q24H  . levothyroxine  88 mcg Oral q morning - 10a  . sodium chloride  3 mL Intravenous Q12H   Continuous Infusions:    . 0.9 % NaCl with KCl 20 mEq / L 20 mL/hr (08/02/12 1849)   PRN Meds:.acetaminophen, acetaminophen, albuterol, ondansetron (ZOFRAN) IV, ondansetron  Assessment/Plan:  Active Problems:  HYPERTENSION  Syncope and collapse  Hypertrophic cardiomyopathy  Hypothyroid  Rhabdomyolysis  CKD (chronic kidney  disease) stage 3, GFR 30-59 ml/min  Dehydration  Bradycardia   1. Syncope with bradycardia.  Patient was admitted to the hospital with syncope and collapse.  Her cardiac enzymes have been unremarkable. TSH is also normal.  She had another symptomatic episode in the hospital and telemetry monitoring showed bradycardia into the 40s.  EKG show sinus rhythm with multiple PACs.  ?sick sinus syndrome.  Patient will likely need to be evaluated by cardiology prior to discharge to see if she is a candidate for pacemaker.  Will continue to monitor on telemetry. Await further recommendations from cardiology.  2. Mild rhabdomyolysis.  Improved with IVF  3. Dehydration, corrected, she is not orthostatic  4. Hypothyroidism.  TSH is normal.  Time spent coordinating care:   LOS: 2 days   MEMON,JEHANZEB [facility] Pager: [phone] 08/03/2012, 7:55 PM

## 2012-08-04 ENCOUNTER — Inpatient Hospital Stay (HOSPITAL_COMMUNITY): Payer: Medicare Other

## 2012-08-04 DIAGNOSIS — D649 Anemia, unspecified: Secondary | ICD-10-CM

## 2012-08-04 DIAGNOSIS — I498 Other specified cardiac arrhythmias: Secondary | ICD-10-CM

## 2012-08-04 DIAGNOSIS — R55 Syncope and collapse: Principal | ICD-10-CM

## 2012-08-04 DIAGNOSIS — I517 Cardiomegaly: Secondary | ICD-10-CM

## 2012-08-04 DIAGNOSIS — I422 Other hypertrophic cardiomyopathy: Secondary | ICD-10-CM

## 2012-08-04 LAB — CBC
Hemoglobin: 10.9 g/dL — ABNORMAL LOW (ref 12.0–15.0)
MCH: 30.3 pg (ref 26.0–34.0)
MCHC: 32.4 g/dL (ref 30.0–36.0)
Platelets: 264 10*3/uL (ref 150–400)
RDW: 13.7 % (ref 11.5–15.5)

## 2012-08-04 LAB — BASIC METABOLIC PANEL
Calcium: 8.5 mg/dL (ref 8.4–10.5)
GFR calc Af Amer: 49 mL/min — ABNORMAL LOW (ref >90)
GFR calc non Af Amer: 43 mL/min — ABNORMAL LOW (ref >90)
Glucose, Bld: 87 mg/dL (ref 70–99)
Potassium: 4.4 mEq/L (ref 3.5–5.1)
Sodium: 141 mEq/L (ref 135–145)

## 2012-08-04 LAB — PRO B NATRIURETIC PEPTIDE: Pro B Natriuretic peptide (BNP): 2511 pg/mL — ABNORMAL HIGH (ref 0–450)

## 2012-08-04 MED ORDER — METOPROLOL SUCCINATE ER 25 MG PO TB24
25.0000 mg | ORAL_TABLET | Freq: Every day | ORAL | Status: DC
  Administered 2012-08-04: 25 mg via ORAL
  Filled 2012-08-04 (×2): qty 1

## 2012-08-04 NOTE — Progress Notes (Signed)
UR Chart Review Completed  

## 2012-08-04 NOTE — Progress Notes (Signed)
[facility]             Progress Note   Subjective: Patient feels better today.  She has not had any further episodes of lightheadedness or dizziness today.  No chest pain or shortness of breath.   Objective: Vital signs in last 24 hours: Temp:  [97.7 F (36.5 C)-97.8 F (36.6 C)] 97.8 F (36.6 C) (11/11 0457) Pulse Rate:  [56-70] 70  (11/11 0457) Resp:  [20] 20  (11/11 0457) BP: (99-154)/(62-70) 154/68 mmHg (11/11 0457) SpO2:  [95 %-96 %] 95 % (11/11 0457) Weight:  [72.8 kg (160 lb 7.9 oz)] 72.8 kg (160 lb 7.9 oz) (11/11 0457) Weight change: 1 kg (2 lb 3.3 oz) Last BM Date: 08/02/12  Intake/Output from previous day: 11/10 0701 - 11/11 0700 In: 1883.3 [P.O.:360; I.V.:1521.3; IV Piggyback:2] Out: -      Physical Exam: General: Alert, awake, oriented x3, in no acute distress. HEENT: No bruits, no goiter. Heart: Regular rate and rhythm, without murmurs, rubs, gallops. Lungs: Clear to auscultation bilaterally. Abdomen: Soft, nontender, nondistended, positive bowel sounds. Extremities: No clubbing cyanosis or edema with positive pedal pulses. Neuro: Grossly intact, nonfocal.    Lab Results: Basic Metabolic Panel:  Basename 08/04/12 0512 08/02/12 0518  NA 141 140  K 4.4 4.7  CL 106 107  CO2 27 26  GLUCOSE 87 95  BUN 11 19  CREATININE 1.17* 1.29*  CALCIUM 8.5 8.6  MG -- --  PHOS -- --   Liver Function Tests: No results found for this basename: AST:2,ALT:2,ALKPHOS:2,BILITOT:2,PROT:2,ALBUMIN:2 in the last 72 hours No results found for this basename: LIPASE:2,AMYLASE:2 in the last 72 hours No results found for this basename: AMMONIA:2 in the last 72 hours CBC:  Basename 08/04/12 0512 08/02/12 0518  WBC 5.9 6.0  NEUTROABS -- --  HGB 10.9* 11.4*  HCT 33.6* 34.2*  MCV 93.3 92.7  PLT 264 261   Cardiac Enzymes:  Basename 08/02/12 0518 08/01/12 2202 08/01/12 1701  CKTOTAL 465* -- --  CKMB -- -- --  CKMBINDEX -- -- --  TROPONINI <0.30 <0.30  <0.30   BNP: No results found for this basename: PROBNP:3 in the last 72 hours D-Dimer:  [facility] 08/01/12 1701  DDIMER 0.61*   CBG:  Basename 08/03/12 0801  GLUCAP 147*   Hemoglobin A1C: No results found for this basename: HGBA1C in the last 72 hours Fasting Lipid Panel: No results found for this basename: CHOL,HDL,LDLCALC,TRIG,CHOLHDL,LDLDIRECT in the last 72 hours Thyroid Function Tests:  Basename 08/01/12 1701  TSH 1.499  T4TOTAL --  FREET4 --  T3FREE --  THYROIDAB --   Anemia Panel: No results found for this basename: VITAMINB12,FOLATE,FERRITIN,TIBC,IRON,RETICCTPCT in the last 72 hours Coagulation: No results found for this basename: LABPROT:2,INR:2 in the last 72 hours Urine Drug Screen: Drugs of Abuse  No results found for this basename: labopia,  cocainscrnur,  labbenz,  amphetmu,  thcu,  labbarb    Alcohol Level: No results found for this basename: ETH:2 in the last 72 hours Urinalysis: No results found for this basename: COLORURINE:2,APPERANCEUR:2,LABSPEC:2,PHURINE:2,GLUCOSEU:2,HGBUR:2,BILIRUBINUR:2,KETONESUR:2,PROTEINUR:2,UROBILINOGEN:2,NITRITE:2,LEUKOCYTESUR:2 in the last 72 hours  No results found for this or any previous visit (from the past 240 hour(s)).  Studies/Results: No results found.  Medications: Scheduled Meds:    . aspirin EC  81 mg Oral q morning - 10a  . enoxaparin (LOVENOX) injection  40 mg Subcutaneous Q24H  . levothyroxine  88 mcg Oral q morning - 10a  . sodium chloride  3 mL Intravenous Q12H   Continuous Infusions:    .  0.9 % NaCl with KCl 20 mEq / L 20 mL/hr (08/02/12 1849)   PRN Meds:.acetaminophen, acetaminophen, albuterol, ondansetron (ZOFRAN) IV, ondansetron  Assessment/Plan:  Active Problems:  HYPERTENSION  Syncope and collapse  Hypertrophic cardiomyopathy  Hypothyroid  Rhabdomyolysis  CKD (chronic kidney disease) stage 3, GFR 30-59 ml/min  Dehydration  Bradycardia   1. Syncope with bradycardia.  Patient was  admitted to the hospital with syncope and collapse.  Her cardiac enzymes have been unremarkable. TSH is also normal.  She had another symptomatic episode in the hospital and telemetry monitoring showed bradycardia into the 40s, but no pauses were noted.  EKG show sinus rhythm with multiple PACs.  ?sick sinus syndrome.  Echo has been ordered and is pending.  Await further recommendations from cardiology regarding disposition and need for further inpatient work up.  If no further inpatient work up is desired, then she can be discharged home today.  2. Mild rhabdomyolysis.  Improved with IVF  3. Dehydration, corrected, she is not orthostatic  4. Hypothyroidism.  TSH is normal.  Time spent coordinating care:   LOS: 3 days   Karna Abed [facility] Pager: [phone] 08/04/2012, 11:00 AM   d

## 2012-08-04 NOTE — Consult Note (Signed)
CARDIOLOGY CONSULT NOTE  Patient ID: DOMINIGUE GELLNER MRN: [id number] DOB/AGE: [birth year] 78 y.o.  Admit date: 08/01/2012 Referring Physician: PTH-Memon Minerva Areola, MD Primary Cardiologist: Dietrich Pates Reason for Consultation: Syncope with Hx of HOCM with newly diagnosed symptomatic bradycardia. Active Problems:  HYPERTENSION  Syncope and collapse  Hypertrophic cardiomyopathy  Hypothyroid  Rhabdomyolysis  CKD (chronic kidney disease) stage 3, GFR 30-59 ml/min  Dehydration  Bradycardia  HPI: Mrs. Mikkelsen is an 78 year old female patient who has been lost to cardiology followup for approximately 5  years. She was seen on consultation in the hospital for a syncopal episode by Dr. Dietrich Pates, echocardiogram was completed and she was diagnosed with hypertrophic cardiomyopathy. She wore a cardiac monitor as an outpatient for approximately 2 weeks, followed up in the [place] office and was told that she was doing well and did not need any further cardiac evaluation per patient recollection. I have reviewed EMR, E. chart, and epic records, and do not find results of cardiac monitor or followup appointment. She also has a history of hypertension, chronic kidney disease, hypothyroidism, cancer of the kidney and ovary.      She was in her usual state of health until Wednesday of last week,;had just finished mopping her floor, heard dog barking outside and went to investigate. When she walked outside she lost consciousness. She awoke with her neighbor there who called 911. She was brought to the emergency room secondary to this and an injured right foot and ankle. X-ray revealed soft tissue swelling over the dorsum of the foot with no acute bony abnormality. She was found  mildly dehydrated and hypotensive on admit, with mild rhabdomyolysis. She was given hydration and monitored.    The following day the patient had an episode of near syncope, with nausea. Per Dr. Shelbie Proctor note, telemetry  revealed bradycardia with a heart rate in the 40s. She was asked to return to complete bed rest and we were asked to evaluate further for, HOCM vs. bradycardia causing this episode. Echocardiogram has been ordered.    It is noted on Dr. Dietrich Pates note on consultation at [facility] 150/12.5  was recommended to be discontinued and a low-dose beta blocker was instead ordered. However the patient has been resumed on ARB with HCTZ since that time. She denies any chest pain, she has chronic shortness of breath, but is otherwise very active and takes care of her own needs without assistance.  Review of systems complete and found to be negative unless listed above   Past Medical History  Diagnosis Date  . Cancer   . Hypertension   . Thyroid disease     History reviewed. No pertinent family history.  History   Social History  . Marital Status: Widowed    Spouse Name: N/A    Number of Children: N/A  . Years of Education: N/A   Occupational History  . Not on file.   Social History Main Topics  . Smoking status: Never Smoker   . Smokeless tobacco: Not on file  . Alcohol Use: No  . Drug Use: No  . Sexually Active: Not on file   Other Topics Concern  . Not on file   Social History Narrative  . No narrative on file    Past Surgical History  Procedure Date  . Abdominal hysterectomy   . Kidney surgery     Prescriptions prior to admission  Medication Sig Dispense Refill  . aspirin EC 81 MG tablet Take 81 mg by mouth every morning.      Marland Kitchen  calcium carbonate (OS-CAL) 600 MG TABS Take 600 mg by mouth 2 (two) times daily with a meal.      . irbesartan-hydrochlorothiazide (AVALIDE) 150-12.5 MG per tablet Take 1 tablet by mouth every morning.      Marland Kitchen levothyroxine (SYNTHROID, LEVOTHROID) 88 MCG tablet Take 88 mcg by mouth every morning.       Physical Exam: Blood pressure 154/68, pulse 70, temperature 97.8 F (36.6 C), temperature source Oral, resp. rate 20, height 5\' 3"  (1.6 m), weight 160 lb  7.9 oz (72.8 kg), SpO2 95.00%.  General: Well developed, well nourished, in no acute distress Head: Eyes PERRLA, No xanthomas.   Normal cephalic and atramatic  Lungs: Clear bilaterally to auscultation and percussion. Heart: HRRR S1 S2,1/6 systolic murmur.  Pulses are 2+ & equal.       Bilateral carotid bruits, L>R. No JVD.  No abdominal bruits. No femoral bruits. Abdomen: Bowel sounds are positive, abdomen soft and non-tender without masses or                  Hernia's noted. Msk:  Back normal, normal gait. Normal strength and tone for age.Ecchymosis, and edema of the  Right foot. Diminished pulses DP bilaterally. Extremities: No clubbing, cyanosis or edema.  DP +1 Neuro: Alert and oriented X 3. Psych:  Good affect, responds appropriately, with some memory deficits.   Labs: Lab Results  Component Value Date   WBC 5.9 08/04/2012   HGB 10.9* 08/04/2012   HCT 33.6* 08/04/2012   MCV 93.3 08/04/2012   PLT 264 08/04/2012    Lab 08/04/12 0512 08/01/12 0906  NA 141 --  K 4.4 --  CL 106 --  CO2 27 --  BUN 11 --  CREATININE 1.17* --  CALCIUM 8.5 --  PROT -- 7.0  BILITOT -- 0.6  ALKPHOS -- 54  ALT -- 14  AST -- 28  GLUCOSE 87 --   Lab Results  Component Value Date   CKTOTAL 465* 08/02/2012   CKMB 2.7 04/12/2008   TROPONINI <0.30 08/02/2012     Echocardiogram: 03/2008 SUMMARY - Overall left ventricular systolic function was normal. There were no left ventricular regional wall motion abnormalities. There was moderate asymmetric septal hypertrophy with a moderate LVOT gradient. - The mean transaortic valve gradient was 26 mmHg. - There was mild fibrocalcific change of the aortic root. - There was mild to moderate mitral annular calcification. The effective orifice of mitral regurgitation by proximal isovelocity surface area was 0.1 cm^2. The volume of mitral regurgitation by proximal isovelocity surface area was 14 cc. - The left atrium was moderately dilated. - The estimated  peak right ventricular systolic pressure was mildly increased.  NWG:NFAOZ rhythm with Premature atrial complexes Right bundle branch block Possible inferior infarct (cited on or before 18-Apr-2008) Possible Anterolateral infarct , age undetermined  ASSESSMENT AND PLAN:   1. Syncopal Episode:  More than one etiology to consider, dehydration, hypotension, and bradycardia with history of HOCM. She denies recurrence of near syncope or weakness, despite documentation of the nurses who were in attendance.  Echocardiogram has been ordered. HOCM could certainly be considered as she continues some symptoms of dizziness. Recommend removing diuretic from antihypertensive medication.   2.  Bradycardia: I have reviewed telemetry and found to her have no significant bradycardia, pauses or arrhythmias. I did see one episode of tachycardia in the early morning hours around 2a, but there were not symptoms documented.  Will continue to evaluate further on telemetry.   Samara Deist  Dayton Scrape NP [facility] 08/04/2012, 8:14 AM  Cardiology Attending Patient interviewed and examined. Discussed with Joni Reining, NP.  Above note annotated and modified based upon my findings.  On exam, a harsh grade 3/6 systolic ejection murmur radiating to the carotids is noted as well as a modest early diastolic murmur.  The aortic component second heart sound is muffled. There are rales posteriorly in both lung fields.  Jugular venous distention is present.  Initial chest x-ray showed bronchitic changes, but no congestive heart failure. Since hydration has been continued for a number of days, and there are findings suggestive of CHF, a repeat film will be obtained as well as a pro BNP level. One set of orthostatic vital signs showed a significant decrease in systolic blood pressure with standing. These will be repeated.  IV fluids will be discontinued. Low-dose beta blocker therapy will be initiated. If no worrisome issues  develop overnight, patient can be discharged to followup in the office. 5 days of event recording have been performed in hospital without any significant arrhythmias identified. This should suffice for monitoring for arrhythmia.  Neither ACE inhibitor nor diuretic should be utilized to treat mild hypertension.  Beta blocker/calcium channel antagonist, particularly verapamil, are preferred.  Warba Bing, MD 08/04/2012, 6:30 PM

## 2012-08-04 NOTE — Progress Notes (Signed)
*  PRELIMINARY RESULTS* Echocardiogram 2D Echocardiogram has been performed.  Carolyn Hughes 08/04/2012, 12:16 PM

## 2012-08-04 NOTE — Clinical Social Work Psychosocial (Signed)
Clinical Social Work Department BRIEF PSYCHOSOCIAL ASSESSMENT 08/04/2012  Patient:  Carolyn Hughes, Carolyn Hughes     Account Number:  [id number]     Admit date:  08/01/2012  Clinical Social Worker:  Nancie Neas  Date/Time:  08/04/2012 03:40 PM  Referred by:  Care Management  Date Referred:  08/04/2012 Referred for  ALF Placement   Other Referral:   Interview type:  Patient Other interview type:    PSYCHOSOCIAL DATA Living Status:  ALONE Admitted from facility:   Level of care:   Primary support name:  Merdis Delay Primary support relationship to patient:  FRIEND Degree of support available:   ?    CURRENT CONCERNS Current Concerns  Post-Acute Placement   Other Concerns:    SOCIAL WORK ASSESSMENT / PLAN CSW received referral from CM regarding pt's concern about living alone. Pt states she fell at home and was down for 3 hours before she got help as her [employer] was not working. Pt said she is generally independent in care but is not sure she should be living alone anymore. She lost her husband in 2001 and has one living relative, a brother who is in his 80s. She has limited support. Pt states she would like to live in a facility. CSW discussed independent living and ALF options. Pt said she receives 1,000 a month and owns her own home. Pt is aware these options are private pay and does not feel she can afford them. She is open to consider selling her home in order to have more funds for independent living. [facility] in [place] notified and will send packet of information to pt's home.   Assessment/plan status:   Other assessment/ plan:   Information/referral to community resources:   Maxwell Caul    PATIENT'S/FAMILY'S RESPONSE TO PLAN OF CARE: Pt appreciative of information and will plan to d/c home from hospital and make decisions on other living arrangements in the future. CSW signing off.        Derenda Fennel, [place] [phone]

## 2012-08-05 ENCOUNTER — Encounter (HOSPITAL_COMMUNITY): Payer: Self-pay | Admitting: Internal Medicine

## 2012-08-05 DIAGNOSIS — N289 Disorder of kidney and ureter, unspecified: Secondary | ICD-10-CM | POA: Diagnosis present

## 2012-08-05 DIAGNOSIS — D649 Anemia, unspecified: Secondary | ICD-10-CM

## 2012-08-05 DIAGNOSIS — E039 Hypothyroidism, unspecified: Secondary | ICD-10-CM

## 2012-08-05 DIAGNOSIS — M6282 Rhabdomyolysis: Secondary | ICD-10-CM

## 2012-08-05 HISTORY — DX: Anemia, unspecified: D64.9

## 2012-08-05 MED ORDER — METOPROLOL SUCCINATE ER 25 MG PO TB24: 25.0000 mg | ORAL_TABLET | Freq: Every day | ORAL | Status: DC

## 2012-08-05 NOTE — Discharge Summary (Signed)
Physician Discharge Summary  Carolyn Hughes UJW:[id number] DOB: 11-Oct-1928 DOA: 08/01/2012  PCP: Dwana Melena, MD  Admit date: 08/01/2012 Discharge date: 08/05/2012  Time spent: Greater than 30 minutes.   Recommendations for Outpatient Follow-up:  1. The patient will followup with Dr. Dietrich Pates or NP, Ms. Lawrence  On 08/12/2012. 2. The patient will followup with her primary care physician Dr. Margo Aye on 08/13/2012. Alternative living arrangements should be addressed. Also, further evaluation of her anemia and appropriate referrals should also be addressed. 3. Home health registered nurse has been ordered for followup of the patient's blood pressure and medication management.   Discharge Diagnoses:  1. Syncope and collapse, likely secondary to dehydration/volume depletion or possibly hypertrophic cardiomyopathy. 2. Hypertrophic obstructive cardiomyopathy. 3. Bradycardia. 4. Mild rhabdomyolysis. 5. Hypothyroidism. The patient's TSH was within normal limits at 1.499. 6. Stage III chronic kidney disease. The patient's creatinine was 1.17 at the time of discharge. 7. Normocytic anemia, in part, secondary to be dilutional effects of the IV fluids. For the outpatient evaluation recommended. 8. Hypertension.    Discharge Condition: Improved and stable.  Diet recommendation: heart healthy.  Filed Weights   08/03/12 0726 08/04/12 0457 08/05/12 0456  Weight: 75.6 kg (166 lb 10.7 oz) 72.8 kg (160 lb 7.9 oz) 77.2 kg (170 lb 3.1 oz)    History of present illness:  The patient is an 78 year old woman with a history significant for hypothyroidism and hypertension, who presented to the emergency department on 08/01/2012 with a chief complaint of passing out. In the emergency department, she was afebrile and hemodynamically stable. Her lab data were significant for a creatinine of 1.3, total CK of 789, and a normal troponin I. her chest x-ray revealed changes of COPD and unchanged 12 mm nodule density in  the lower right lung. CT of her head revealed no intracranial trauma, but with microvascular disease and atrophy, unchanged. X-rays of her right lower extremity revealed no acute bony abnormalities. Her EKG revealed sinus rhythm with a right bundle branch block. She was admitted for further evaluation and management.    Hospital Course:  The patient was started on IV fluids for hydration. She was continued on all if not most of her chronic medications. Avalide was discontinued. For further evaluation, a number of studies were ordered. Her troponin I was negative x3. Her urinalysis revealed no signs of infection. Her followup creatinine improved to 1.13. Her followups total CK improved in 465 with IV fluid hydration. Her d-dimer was slightly elevated, but this was thought to be related to dehydration and acute renal insufficiency. She had no pulmonary symptomatology. Her TSH was within normal limits at 1.499. Her followup hemoglobin decreased to 11.4 and then to 10.9, owing to the dilutional effects of the IV fluids. Nevertheless, further evaluation of her anemia is recommended. Her 2-D echocardiogram revealed an ejection fraction of 55-60%, but with a ventricular septum that was moderately to markedly increased. The patient's heart rate began to fall into the 30s to 40s per telemetry while asleep. Orthostatic vital signs revealed no significant decrease in her systolic blood pressure when standing.  Because of the echocardiogram findings and the bradycardia, cardiologist, Dr. Dietrich Pates was consulted. Per his assessment, the patient's initial chest x-ray revealed bronchitic changes but no congestive heart failure. Per his review of telemetry, there were no significant arrhythmias identified. He recommended neither an ACE inhibitor nor a diuretic should be utilized to treat mild hypertension. He recommended a beta blocker or calcium channel blocker, particularly verapamil being preferred. Nevertheless,  metoprolol  XL was started. Following the start of metoprolol, the patient had no significant bradycardia and no evidence of sinus pauses.  The patient remained afebrile and hemodynamically stable. Occasionally, she became slightly confused, but overall, she was alert and oriented. She did have some fear about going home alone. She has a close friend and stepchildren who plan to look in on her on a regular basis. She was also given an option of assisted living facility placement, but she was unwilling to pay out of pocket or to give her a per home. She was ambulating independently throughout the entire hospitalization, and therefore, she was not a candidate for skilled nursing facility placement. She does have a lifeline. She was encouraged to discuss alternative living arrangements with her family and with her primary care physician Dr. Margo Aye. A home health registered nurse was ordered for followup evaluation of her heart rate and medication management.   Procedures:  2-D echocardiogram 08/04/2012:  Study Conclusions  - Left ventricle: The cavity size was moderately reduced. Systolic function was normal. The estimated ejection fraction was in the range of 55% to 60%. Wall motion was normal; there were no regional wall motion abnormalities. - Ventricular septum: Thickness was moderately to markedly increased. - Aortic valve: Mildly to moderately calcified annulus. Mildly thickened, mildly calcified leaflets. Valve area: 2.19cm^2(VTI). Valve area: 2.14cm^2 (Vmax). - Mitral valve: Moderately calcified annulus. - Left atrium: The atrium was mildly to moderately dilated. - Right ventricle: The cavity size was normal. Wall thickness was mildly increased. - Atrial septum: No defect or patent foramen ovale was identified. Impressions:  - Moderate to severeasymmetric septal hypertrophy with mild systolic anterior motion of the mitral valve and a modest LVOT gradient. Compared to the prior study  performed 08/04/12, there has been no significant interval change. Septal hypertrophy is somewhat more impressive, but the LVOT gradient is less impressive. Transthoracic echocardiography. M-mode, complete 2D, spectral Doppler, and color Doppler. Height: Height: 160cm. Height: 63in. Weight: Weight: 72.6kg. Weight: 159.7lb. Body mass index: BMI: 28.3kg/m^2. Body surface area: BSA: 1.40m^2. Patient status: Inpatient. Location: Bedside.   Consultations:  Cardiologist, Dr. Dietrich Pates and Dr. Daleen Squibb.  Discharge Exam: Filed Vitals:   08/04/12 1419 08/04/12 2100 08/05/12 0456 08/05/12 1012  BP:  104/61 137/68   Pulse:  59 68 52  Temp:  97.9 F (36.6 C) 97.5 F (36.4 C)   TempSrc:  Oral Oral   Resp:  20 18   Height:      Weight:   77.2 kg (170 lb 3.1 oz)   SpO2: 97% 95% 95%     General: the patient is standing up in her room, putting on her close. No acute distress.  Cardiovascular: S1, S2, with a 2/6 systolic murmur.  Respiratory: clear to auscultation bilaterally. Neurologic: She is alert and oriented x2. Cranial nerves II through XII are intact. Her speech is clear. Gait is within normal limits.  Discharge Instructions  Discharge Orders    Future Appointments: Provider: Department: Dept Phone: Center:   08/12/2012 1:40 PM Jodelle Gross, NP [facility] [phone] [id number]     Future Orders Please Complete By Expires   Diet - low sodium heart healthy      Increase activity slowly      Discharge instructions      Comments:   Your previous blood pressure medicine irbesartan/hydrochlorothiazide has been stopped. You have now been prescribed the new medication metoprolol XL for your blood pressure and for your heart. Discuss alternate  living arrangements with your primary care physician, Dr. Margo Aye.       Medication List     As of 08/05/2012  2:51 PM    STOP taking these medications         irbesartan-hydrochlorothiazide 150-12.5 MG per tablet    Commonly known as: AVALIDE      TAKE these medications         aspirin EC 81 MG tablet   Take 81 mg by mouth every morning.      calcium carbonate 600 MG Tabs   Commonly known as: OS-CAL   Take 600 mg by mouth 2 (two) times daily with a meal.      levothyroxine 88 MCG tablet   Commonly known as: SYNTHROID, LEVOTHROID   Take 88 mcg by mouth every morning.      metoprolol succinate 25 MG 24 hr tablet   Commonly known as: TOPROL-XL   Take 1 tablet (25 mg total) by mouth daily. New medicine for your blood pressure and for your heart.           Follow-up Information    Follow up with Joni Reining, NP. On 08/12/2012. (1:40 p)    Contact information:   [facility] [address] [place], [place] [phone]       Follow up with [facility], MD. On 08/13/2012. (Appointment is scheduled for 10:45 AM.)    Contact information:   [address] [place] [address] [phone]           The results of significant diagnostics from this hospitalization (including imaging, microbiology, ancillary and laboratory) are listed below for reference.    Significant Diagnostic Studies: Dg Chest 2 View  08/04/2012  *RADIOLOGY REPORT*  Clinical Data: Fall 1 week ago.  CHF.  History of renal cell carcinoma and hypertension.  CHEST - 2 VIEW  Comparison: 08/01/2012  Findings: Accentuated thoracic kyphosis.  A mild thoracolumbar junction compression deformity is similar, and is without canal compromise.  Aortic atherosclerosis.  Mild osteopenia.  Surgical clips in the low neck.  Mild cardiomegaly with a tortuous thoracic aorta.  Small left pleural effusion versus thickening, similar. No pneumothorax.  The right lower lobe pulmonary nodule described on the prior is likely present.  There is new mild pulmonary interstitial thickening.  Patchy left base air space disease is similar.  IMPRESSION:  1.  New pulmonary interstitial prominence, suspicious for mild pulmonary venous  congestion.  No overt congestive failure. 2.  The right lung base nodule described on the prior plain film may be present but is less readily apparent due to development of venous congestion. 3.  Similar appearance of the inferior left hemithorax.  Small amount of fluid versus pleural thickening with adjacent scarring or atelectasis. 4.  Osteopenia with mild thoracolumbar junction compression deformity, unchanged.   Original Report Authenticated By: Jeronimo Greaves, M.D.    Dg Chest 2 View  08/01/2012  *RADIOLOGY REPORT*  Clinical Data: Fall, loss of consciousness, history hypertension, renal cancer, ovarian cancer  CHEST - 2 VIEW  Comparison: 05/08/2012 Correlation:  CT chest 05/25/2010  Findings: Upper normal heart size. Calcified tortuous aorta. Pulmonary vascularity normal. Mild chronic left basilar atelectasis. Underlying emphysematous and chronic bronchitic changes. Chronic increase in medial right lower lobe markings stable. No definite acute infiltrate or pleural effusion. No pneumothorax. 12 mm nodular density mid right lung, roughly corresponding in position with a 9 mm diameter nodule seen on prior CT. No acute osseous findings. Bones appear diffusely  demineralized.  IMPRESSION: Changes of COPD with chronic bibasilar atelectasis versus scarring. 12 mm nodular density lower right lung, unchanged since 05/08/2012 and roughly corresponding to a known right lung base nodule which measured 9 mm by CT. This likely represents a stable nodule when accounting for magnification inherent to radiography. No definite acute process identified.   Original Report Authenticated By: Ulyses Southward, M.D.    Dg Hip Complete Right  08/01/2012  *RADIOLOGY REPORT*  Clinical Data: Right hip pain post fall, bruising, tenderness  RIGHT HIP - COMPLETE 2+ VIEW  Comparison: None  Findings: Osseous demineralization. Symmetric hip and SI joints. Scattered atherosclerotic calcifications. Degenerative disc disease changes lower lumbar  spine. No acute fracture, dislocation or bone destruction.  IMPRESSION: No acute osseous abnormalities.   Original Report Authenticated By: Ulyses Southward, M.D.    Ct Head Wo Contrast  08/01/2012  *RADIOLOGY REPORT*  Clinical Data: Fall, loss of consciousness  CT HEAD WITHOUT CONTRAST  Technique:  Contiguous axial images were obtained from the base of the skull through the vertex without contrast.  Comparison: Brain MRI 07/19/2009  Findings: No intracranial hemorrhage.  No parenchymal contusion. No midline shift or mass effect.  Basilar cisterns are patent. No skull base fracture.  No fluid in the paranasal sinuses or mastoid air cells.  There is generalized cortical atrophy and ventricular dilatation unchanged.  There is deep white matter hypodensities in the internal and external capsules which also unchanged.  IMPRESSION:  1.  No intracranial trauma. 2.  Microvascular disease and atrophy unchanged from comparison MRI.   Original Report Authenticated By: Genevive Bi, M.D.    Dg Foot Complete Right  08/01/2012  *RADIOLOGY REPORT*  Clinical Data: Fall, loss of consciousness.  Pain, swelling.  RIGHT FOOT COMPLETE - 3+ VIEW  Comparison: None.  Findings: Mild degenerative changes at the first MTP joint.  Soft tissue swelling over the dorsum of the foot.  No acute bony abnormality.  No acute fracture, subluxation or dislocation.  IMPRESSION: No acute bony abnormality.   Original Report Authenticated By: Charlett Nose, M.D.     Microbiology: No results found for this or any previous visit (from the past 240 hour(s)).   Labs: Basic Metabolic Panel:  Lab 08/04/12 1610 08/02/12 0518 08/01/12 1701 08/01/12 0906  NA 141 140 -- 136  K 4.4 4.7 -- 4.2  CL 106 107 -- 98  CO2 27 26 -- 28  GLUCOSE 87 95 -- 103*  BUN 11 19 -- 21  CREATININE 1.17* 1.29* 1.13* 1.30*  CALCIUM 8.5 8.6 -- 8.9  MG -- -- -- --  PHOS -- -- -- --   Liver Function Tests:  Lab 08/01/12 0906  AST 28  ALT 14  ALKPHOS 54  BILITOT  0.6  PROT 7.0  ALBUMIN 3.4*   No results found for this basename: LIPASE:5,AMYLASE:5 in the last 168 hours No results found for this basename: AMMONIA:5 in the last 168 hours CBC:  Lab 08/04/12 0512 08/02/12 0518 08/01/12 1701 08/01/12 0906  WBC 5.9 6.0 7.6 8.0  NEUTROABS -- -- -- 5.8  HGB 10.9* 11.4* 12.6 13.3  HCT 33.6* 34.2* 37.6 39.6  MCV 93.3 92.7 91.7 91.9  PLT 264 261 302 304   Cardiac Enzymes:  Lab 08/02/12 0518 08/01/12 2202 08/01/12 1701 08/01/12 0906  CKTOTAL 465* -- -- 789*  CKMB -- -- -- --  CKMBINDEX -- -- -- --  TROPONINI <0.30 <0.30 <0.30 <0.30   BNP: BNP (last 3 results)  Basename 08/04/12 1930  PROBNP 2511.0*   CBG:  Lab 08/03/12 0801  GLUCAP 147*       Signed:  Dorla Guizar  [facility] 08/05/2012, 2:51 PM

## 2012-08-05 NOTE — Progress Notes (Signed)
Pt discharged home today per Dr. Sherrie Mustache. Pt's IV site D/C'd and WNL. Pt's VS stable at this time. Pt provided with home medication list, discharge instructions and prescriptions. Pt verbalized understanding. Pt made aware of F/U appointments with PCP and Cardiology NP. Pt verbalized understanding. Pt left floor via WC in stable condition accompanied by NT.

## 2012-08-05 NOTE — Progress Notes (Addendum)
SUBJECTIVE:Slightly confused, focused on getting car keys from a friend and her suitcase. Denies chest pain or near syncope.  Active Problems:  HYPERTENSION  Hypertrophic obstructive cardiomyopathy  Syncope and collapse  Hypothyroid  Rhabdomyolysis  CKD (chronic kidney disease) stage 3, GFR 30-59 ml/min  Dehydration  Bradycardia   LABS: Basic Metabolic Panel:  Basename 08/04/12 0512  NA 141  K 4.4  CL 106  CO2 27  GLUCOSE 87  BUN 11  CREATININE 1.17*  CALCIUM 8.5  MG --  PHOS --   CBC:  Basename 08/04/12 0512  WBC 5.9  NEUTROABS --  HGB 10.9*  HCT 33.6*  MCV 93.3  PLT 264   RADIOLOGY: Dg Chest 2 View  08/04/2012  *RADIOLOGY REPORT*  Clinical Data: Fall 1 week ago.  CHF.  History of renal cell carcinoma and hypertension.  CHEST - 2 VIEW  Comparison: 08/01/2012  Findings: Accentuated thoracic kyphosis.  A mild thoracolumbar junction compression deformity is similar, and is without canal compromise.  Aortic atherosclerosis.  Mild osteopenia.  Surgical clips in the low neck.  Mild cardiomegaly with a tortuous thoracic aorta.  Small left pleural effusion versus thickening, similar. No pneumothorax.  The right lower lobe pulmonary nodule described on the prior is likely present.  There is new mild pulmonary interstitial thickening.  Patchy left base air space disease is similar.  IMPRESSION:  1.  New pulmonary interstitial prominence, suspicious for mild pulmonary venous congestion.  No overt congestive failure. 2.  The right lung base nodule described on the prior plain film may be present but is less readily apparent due to development of venous congestion. 3.  Similar appearance of the inferior left hemithorax.  Small amount of fluid versus pleural thickening with adjacent scarring or atelectasis. 4.  Osteopenia with mild thoracolumbar junction compression deformity, unchanged.   Original Report Authenticated By: Jeronimo Greaves, M.D.    ECHOCARDIOGRAM 08/04/12 Left ventricle:  The cavity size was moderately reduced. Systolic function was normal. The estimated ejection fraction was in the range of 55% to 60%. Ahlayah Tarkowski motion was normal; there were no regional Tyjanae Bartek motion abnormalities. - Ventricular septum: Thickness was moderately to markedly increased. - Aortic valve: Mildly to moderately calcified annulus. Mildly thickened, mildly calcified leaflets. Valve area: 2.19cm^2(VTI). Valve area: 2.14cm^2 (Vmax). - Mitral valve: Moderately calcified annulus. - Left atrium: The atrium was mildly to moderately dilated. - Right ventricle: The cavity size was normal. Zameer Borman thickness was mildly increased. - Atrial septum: No defect or patent foramen ovale was identified. Impressions:  LVOT gradient, Diam, S 17 mm ------ S Area 2.27 cm^2 ------ Peak 24 mm Hg ------   - Moderate to severe asymmetric septal hypertrophy with mild systolic anterior motion of the mitral valve and a modest LVOT gradient. Compared to the prior study performed 08/04/12, there has been no significant interval change. Septal hypertrophy is somewhat more impressive, but the LVOT gradient is less impressive.    PHYSICAL EXAM BP 137/68  Pulse 68  Temp 97.5 F (36.4 C) (Oral)  Resp 18  Ht 5\' 3"  (1.6 m)  Wt 170 lb 3.1 oz (77.2 kg)  BMI 30.15 kg/m2  SpO2 95% General: Well developed, well nourished, in no acute distress Head: Eyes PERRLA, No xanthomas.   Normal cephalic and atramatic  Lungs: Clear bilaterally with some crackles in the left base. Heart: HRRR S1 S2, 2-3/6 systolic murmur, radiating in to the carotids bilaterally, L>R .  Pulses are 2+ & equal.  Bilateral carotid bruit. No JVD.  No abdominal bruits. No femoral bruits. Abdomen: Bowel sounds are positive, abdomen soft and non-tender without masses or                  Hernia's noted. Msk:  Back normal, normal gait. Normal strength and tone for age. Extremities: No clubbing, cyanosis or edema.  DP +1 Neuro: Alert and  oriented X 3. Psych:  Good affect, responds appropriately  TELEMETRY: Reviewed telemetry pt in NSR. Review of last 24 hours shows episodes of tachycardiac 114-120 bpm.   ASSESSMENT AND PLAN:  1. HOCM: Patient is without complaint at this time. He has been recommended by Dr. Dietrich Pates that she began on a low-dose beta blocker, diuretic should be discontinued. She is now on metoprolol 25 mg XL daily. Reviewed telemetry did not show significant bradycardia or pauses. Would not reinstitute Avalide on discharge. Review of orthostatics revealed no significant orthostasis her blood pressure graph. Echocardiogram as described above, states that septal hypertrophy  is somewhat more impressive but the LVOT gradient is less impressive. We will make a followup appointment in our office for continued assessment. She appears stable from a cardiovascular standpoint and could potentially go home today unless other factors are prohibiting this to include age, mild dementia and need for home assistance with HHN. Follow up appointment has been made for Nov 19th in our office  2. Syncopal Episode: Most likely related to dehydration and hypotension. She is improved with IV fluids and medication adjustments. Was not recommended that the patient have a repeat cardiac monitor as an outpatient. Could potentially set up a continuous blood pressure monitoring, but uncertain if she would be able to tolerate this  Do. recommend home health nurses to come and check on her for vital signs and general stamina posthospitalization.  Bettey Mare. Lyman Bishop NP [facility] 08/05/2012, 8:26 AM  Patient examined and agree except changes made. Patient confided in me that she does not feel comfortable going home where she lives by herself. She is giving up driving with this spell. I've asked the nursing staff to have care management evaluate for a nursing facility. The patient is happy with this.  Valera Castle, MD 08/05/2012 1:25 PM

## 2012-08-08 ENCOUNTER — Emergency Department (HOSPITAL_COMMUNITY)
Admission: EM | Admit: 2012-08-08 | Discharge: 2012-08-08 | Disposition: A | Discharge status: Home / Self Care | Payer: Medicare Other | Attending: Emergency Medicine | Admitting: Emergency Medicine

## 2012-08-08 ENCOUNTER — Encounter (HOSPITAL_COMMUNITY): Payer: Self-pay | Admitting: *Deleted

## 2012-08-08 ENCOUNTER — Emergency Department (HOSPITAL_COMMUNITY): Payer: Medicare Other

## 2012-08-08 DIAGNOSIS — Z85850 Personal history of malignant neoplasm of thyroid: Secondary | ICD-10-CM | POA: Insufficient documentation

## 2012-08-08 DIAGNOSIS — R05 Cough: Secondary | ICD-10-CM | POA: Insufficient documentation

## 2012-08-08 DIAGNOSIS — Z79899 Other long term (current) drug therapy: Secondary | ICD-10-CM | POA: Insufficient documentation

## 2012-08-08 DIAGNOSIS — R1110 Vomiting, unspecified: Secondary | ICD-10-CM | POA: Insufficient documentation

## 2012-08-08 DIAGNOSIS — R059 Cough, unspecified: Secondary | ICD-10-CM | POA: Insufficient documentation

## 2012-08-08 DIAGNOSIS — E079 Disorder of thyroid, unspecified: Secondary | ICD-10-CM | POA: Insufficient documentation

## 2012-08-08 DIAGNOSIS — Z862 Personal history of diseases of the blood and blood-forming organs and certain disorders involving the immune mechanism: Secondary | ICD-10-CM | POA: Insufficient documentation

## 2012-08-08 DIAGNOSIS — I421 Obstructive hypertrophic cardiomyopathy: Secondary | ICD-10-CM | POA: Insufficient documentation

## 2012-08-08 DIAGNOSIS — K529 Noninfective gastroenteritis and colitis, unspecified: Secondary | ICD-10-CM

## 2012-08-08 DIAGNOSIS — I129 Hypertensive chronic kidney disease with stage 1 through stage 4 chronic kidney disease, or unspecified chronic kidney disease: Secondary | ICD-10-CM | POA: Insufficient documentation

## 2012-08-08 DIAGNOSIS — K5289 Other specified noninfective gastroenteritis and colitis: Secondary | ICD-10-CM | POA: Insufficient documentation

## 2012-08-08 DIAGNOSIS — N183 Chronic kidney disease, stage 3 unspecified: Secondary | ICD-10-CM | POA: Insufficient documentation

## 2012-08-08 LAB — URINALYSIS, ROUTINE W REFLEX MICROSCOPIC
Leukocytes, UA: NEGATIVE
Nitrite: NEGATIVE
Specific Gravity, Urine: 1.015 (ref 1.005–1.030)
pH: 6.5 (ref 5.0–8.0)

## 2012-08-08 LAB — CBC WITH DIFFERENTIAL/PLATELET
Basophils Absolute: 0 10*3/uL (ref 0.0–0.1)
Basophils Relative: 0 % (ref 0–1)
Hemoglobin: 14.1 g/dL (ref 12.0–15.0)
Lymphocytes Relative: 17 % (ref 12–46)
MCHC: 33.7 g/dL (ref 30.0–36.0)
Monocytes Relative: 7 % (ref 3–12)
Neutro Abs: 6.3 10*3/uL (ref 1.7–7.7)
Neutrophils Relative %: 72 % (ref 43–77)
WBC: 8.8 10*3/uL (ref 4.0–10.5)

## 2012-08-08 LAB — BASIC METABOLIC PANEL
BUN: 11 mg/dL (ref 6–23)
CO2: 28 mEq/L (ref 19–32)
Chloride: 97 mEq/L (ref 96–112)
GFR calc Af Amer: 53 mL/min — ABNORMAL LOW (ref >90)
Potassium: 3.7 mEq/L (ref 3.5–5.1)

## 2012-08-08 LAB — LIPASE, BLOOD: Lipase: 34 U/L (ref 11–59)

## 2012-08-08 LAB — URINE MICROSCOPIC-ADD ON

## 2012-08-08 LAB — HEPATIC FUNCTION PANEL
Albumin: 3.6 g/dL (ref 3.5–5.2)
Total Protein: 7.6 g/dL (ref 6.0–8.3)

## 2012-08-08 MED ORDER — SODIUM CHLORIDE 0.9 % IV SOLN
Freq: Once | INTRAVENOUS | Status: AC
  Administered 2012-08-08: 14:00:00 via INTRAVENOUS

## 2012-08-08 MED ORDER — ONDANSETRON HCL 4 MG/2ML IJ SOLN
4.0000 mg | Freq: Once | INTRAMUSCULAR | Status: AC
  Administered 2012-08-08: 4 mg via INTRAVENOUS
  Filled 2012-08-08: qty 2

## 2012-08-08 MED ORDER — ONDANSETRON HCL 8 MG PO TABS: 8.0000 mg | ORAL_TABLET | ORAL | Status: AC | PRN

## 2012-08-08 MED ORDER — PANTOPRAZOLE SODIUM 40 MG IV SOLR
40.0000 mg | Freq: Once | INTRAVENOUS | Status: AC
  Administered 2012-08-08: 40 mg via INTRAVENOUS
  Filled 2012-08-08: qty 40

## 2012-08-08 MED ORDER — SODIUM CHLORIDE 0.9 % IV SOLN
INTRAVENOUS | Status: DC
  Administered 2012-08-08: 14:00:00 via INTRAVENOUS

## 2012-08-08 MED ORDER — SODIUM CHLORIDE 0.9 % IV BOLUS (SEPSIS)
1000.0000 mL | Freq: Once | INTRAVENOUS | Status: AC
  Administered 2012-08-08: 1000 mL via INTRAVENOUS

## 2012-08-08 NOTE — ED Provider Notes (Addendum)
History   This chart was scribed for Donnetta Hutching, MD by Toya Smothers, ED Scribe. The patient was seen in room [facility]04/[facility]04. Patient's care was started at 1317.  CSN: [id number]  Arrival date & time 08/08/12  1317   First MD Initiated Contact with Patient 08/08/12 1333      Chief Complaint  Patient presents with  . Abdominal Pain   HPI  Carolyn Hughes is a 78 y.o. female who is typically healthy, presents to the Emergency Department complaining of sudden onset, constant, moderate abdominal pain inferior to umbilicus, with associate emesis twice this morning.  Emesis this morning (x2) moderate      Pt was admitted last week or Syncope. Was d/ced a few days ago.   No diarrhea, fever, chest pain, dyspnea, dysuria, neuro deficits  Past Medical History  Diagnosis Date  . Hypertension   . Anemia 08/05/2012  . Hypertrophic obstructive cardiomyopathy 09/09/2006    Qualifier: Diagnosis of  By: Jen Mow MD, Wynona Canes    . Syncope and collapse 08/01/2012    Secondary to dehydration  . Cancer   . CKD (chronic kidney disease) stage 3, GFR 30-59 ml/min 08/01/2012  . Thyroid disease     Past Surgical History  Procedure Date  . Abdominal hysterectomy   . Kidney surgery   . Surgery for thyroid cancer   . Nephrectomy     History reviewed. No pertinent family history.  History  Substance Use Topics  . Smoking status: Never Smoker   . Smokeless tobacco: Not on file  . Alcohol Use: No   Review of Systems  Constitutional: Negative for fever.  Respiratory: Positive for cough.   Gastrointestinal: Positive for vomiting and abdominal pain. Negative for nausea, diarrhea and blood in stool.  Genitourinary: Negative for dysuria and frequency.  All other systems reviewed and are negative.    Allergies  Penicillins and Iohexol  Home Medications   Current Outpatient Rx  Name  Route  Sig  Dispense  Refill  . ASPIRIN EC 81 MG PO TBEC   Oral   Take 81 mg by mouth every morning.           Marland Kitchen CALCIUM CARBONATE 600 MG PO TABS   Oral   Take 600 mg by mouth 2 (two) times daily with a meal.         . LEVOTHYROXINE SODIUM 88 MCG PO TABS   Oral   Take 88 mcg by mouth every morning.         Marland Kitchen METOPROLOL SUCCINATE ER 25 MG PO TB24   Oral   Take 1 tablet (25 mg total) by mouth daily. New medicine for your blood pressure and for your heart.   30 tablet   2     BP 137/84  Pulse 94  Temp 97.5 F (36.4 C) (Oral)  Resp 20  Ht 5\' 4"  (1.626 m)  Wt 170 lb (77.111 kg)  BMI 29.18 kg/m2  SpO2 95%  Physical Exam  Nursing note and vitals reviewed. Constitutional: She is oriented to person, place, and time. She appears well-developed and well-nourished.  HENT:  Head: Normocephalic and atraumatic.  Eyes: Conjunctivae normal and EOM are normal. Pupils are equal, round, and reactive to light.  Neck: Normal range of motion. Neck supple.  Cardiovascular: Normal rate, regular rhythm and normal heart sounds.   Pulmonary/Chest: Effort normal and breath sounds normal.  Abdominal: Soft. Bowel sounds are normal.       Abdomen is nontender  Musculoskeletal: Normal  range of motion.  Neurological: She is alert and oriented to person, place, and time.  Skin: Skin is warm and dry.  Psychiatric: She has a normal mood and affect.    ED Course  Procedures DIAGNOSTIC STUDIES: Oxygen Saturation is 95% on room air, normal by my interpretation.    COORDINATION OF CARE: 14:01- Evaluated Pt. Pt is awake, alert, and without distress.     Labs Reviewed  BASIC METABOLIC PANEL - Abnormal; Notable for the following:    Glucose, Bld 159 (*)     Creatinine, Ser 1.11 (*)     GFR calc non Af Amer 45 (*)     GFR calc Af Amer 53 (*)     All other components within normal limits  URINALYSIS, ROUTINE W REFLEX MICROSCOPIC - Abnormal; Notable for the following:    Hgb urine dipstick TRACE (*)     All other components within normal limits  CBC WITH DIFFERENTIAL  LIPASE, BLOOD  HEPATIC FUNCTION  PANEL  URINE MICROSCOPIC-ADD ON   No results found.   No diagnosis found.    MDM  No acute abdomen..   Feeling better after IV fluids, IV Zofran, IV Protonix      I personally performed the services described in this documentation, which was scribed in my presence. The recorded information has been reviewed and is accurate.   Donnetta Hutching, MD 08/08/12 4782  Donnetta Hutching, MD 08/19/12 [phone]

## 2012-08-08 NOTE — ED Notes (Signed)
Low abd pain, nausea, vomiting, no diarrhea, Recent adm for injury to rt foot.

## 2012-08-12 ENCOUNTER — Encounter: Payer: Medicare Other | Admitting: Adult Health

## 2012-08-18 ENCOUNTER — Emergency Department (HOSPITAL_COMMUNITY)
Admission: EM | Admit: 2012-08-18 | Discharge: 2012-08-18 | Disposition: A | Discharge status: Home / Self Care | Payer: Medicare Other | Attending: Emergency Medicine | Admitting: Emergency Medicine

## 2012-08-18 ENCOUNTER — Encounter (HOSPITAL_COMMUNITY): Payer: Self-pay

## 2012-08-18 ENCOUNTER — Emergency Department (HOSPITAL_COMMUNITY): Payer: Medicare Other

## 2012-08-18 ENCOUNTER — Encounter: Payer: Medicare Other | Admitting: Adult Health

## 2012-08-18 DIAGNOSIS — R0789 Other chest pain: Secondary | ICD-10-CM

## 2012-08-18 DIAGNOSIS — R05 Cough: Secondary | ICD-10-CM | POA: Insufficient documentation

## 2012-08-18 DIAGNOSIS — R059 Cough, unspecified: Secondary | ICD-10-CM | POA: Insufficient documentation

## 2012-08-18 DIAGNOSIS — Z90710 Acquired absence of both cervix and uterus: Secondary | ICD-10-CM | POA: Insufficient documentation

## 2012-08-18 DIAGNOSIS — R042 Hemoptysis: Secondary | ICD-10-CM | POA: Insufficient documentation

## 2012-08-18 DIAGNOSIS — B9789 Other viral agents as the cause of diseases classified elsewhere: Secondary | ICD-10-CM | POA: Insufficient documentation

## 2012-08-18 DIAGNOSIS — Z862 Personal history of diseases of the blood and blood-forming organs and certain disorders involving the immune mechanism: Secondary | ICD-10-CM | POA: Insufficient documentation

## 2012-08-18 DIAGNOSIS — Z85528 Personal history of other malignant neoplasm of kidney: Secondary | ICD-10-CM | POA: Insufficient documentation

## 2012-08-18 DIAGNOSIS — E079 Disorder of thyroid, unspecified: Secondary | ICD-10-CM | POA: Insufficient documentation

## 2012-08-18 DIAGNOSIS — Z7982 Long term (current) use of aspirin: Secondary | ICD-10-CM | POA: Insufficient documentation

## 2012-08-18 DIAGNOSIS — B349 Viral infection, unspecified: Secondary | ICD-10-CM

## 2012-08-18 DIAGNOSIS — N183 Chronic kidney disease, stage 3 unspecified: Secondary | ICD-10-CM | POA: Insufficient documentation

## 2012-08-18 DIAGNOSIS — I421 Obstructive hypertrophic cardiomyopathy: Secondary | ICD-10-CM | POA: Insufficient documentation

## 2012-08-18 DIAGNOSIS — Z79899 Other long term (current) drug therapy: Secondary | ICD-10-CM | POA: Insufficient documentation

## 2012-08-18 DIAGNOSIS — Z905 Acquired absence of kidney: Secondary | ICD-10-CM | POA: Insufficient documentation

## 2012-08-18 DIAGNOSIS — I129 Hypertensive chronic kidney disease with stage 1 through stage 4 chronic kidney disease, or unspecified chronic kidney disease: Secondary | ICD-10-CM | POA: Insufficient documentation

## 2012-08-18 LAB — BASIC METABOLIC PANEL
BUN: 12 mg/dL (ref 6–23)
Chloride: 98 mEq/L (ref 96–112)
GFR calc Af Amer: 55 mL/min — ABNORMAL LOW (ref >90)
Glucose, Bld: 98 mg/dL (ref 70–99)
Potassium: 4 mEq/L (ref 3.5–5.1)

## 2012-08-18 LAB — CBC
HCT: 40.4 % (ref 36.0–46.0)
Hemoglobin: 13.6 g/dL (ref 12.0–15.0)
RBC: 4.33 MIL/uL (ref 3.87–5.11)
RDW: 13.7 % (ref 11.5–15.5)
WBC: 6.3 10*3/uL (ref 4.0–10.5)

## 2012-08-18 MED ORDER — SODIUM CHLORIDE 0.9 % IV BOLUS (SEPSIS)
500.0000 mL | Freq: Once | INTRAVENOUS | Status: AC
  Administered 2012-08-18: 500 mL via INTRAVENOUS

## 2012-08-18 NOTE — ED Notes (Signed)
RN at bedside

## 2012-08-18 NOTE — ED Notes (Signed)
Per ems, pt reports left sided chest pain that began this a.m.  Pt reports the pain felt like pressure.  Pt denies any n/v, diaphoresis, dizziness, or sob.  Pt reports having a fall last week and was hospitalized for 5 days.

## 2012-08-18 NOTE — ED Notes (Signed)
Pt unable to find a ride home.  [place] transportation service coming to pick up pt.  Pt sitting up in chair eating lunch.

## 2012-08-18 NOTE — ED Provider Notes (Signed)
History  This chart was scribed for Jones Skene, MD by Shari Heritage, ED Scribe. The patient was seen in room [facility]18/[facility]18. Patient's care was started at 0800.   CSN: [id number]  Arrival date & time 08/18/12  9562   First MD Initiated Contact with Patient 08/18/12 0800      Chief Complaint  Patient presents with  . Chest Pain    The history is provided by the patient. No language interpreter was used.    HPI Comments: Carolyn Hughes is a 78 y.o. female who presents to the Emergency Department complaining of two episodes of sharp, moderate, non-radiating, central chest pain onset 1-2 hours ago. There is associated non-productive cough and rhinorrhea. Chest pain is not present at this time. Patient says that the pain is intermittent and that she has never experienced this type of pain before. She rates pain as 4/10. Patient states that pain improved both times after she coughed.  Patient denies fever, chills, otalgia, facial pain, rash, SOB, nausea, vomiting or abdominal pain. Patient hasn't taken any medications at home for symptom relief. She hasn't eaten or had anything to drink this morning. Patient denies history of cardiac catheterization, stent or heart attack. She states that she has had a stress test before, but does not have a cardiologist. Patient has a medical history of kidney cancer and nephrectomy. Other medical history includes HTN, anemia and thyroid disease. Patient does not smoke.  Patient also reports right foot pain resulting from a fall that occurred last week. Patient describes the pain as soreness with some mild swelling. Patient states that she had an x-ray after the incident that showed no fracture. Patient reports that she is ambulatory and uses a cane to walk.  Past Medical History  Diagnosis Date  . Hypertension   . Anemia 08/05/2012  . Hypertrophic obstructive cardiomyopathy 09/09/2006    Qualifier: Diagnosis of  By: Jen Mow MD, Wynona Canes    . Syncope and  collapse 08/01/2012    Secondary to dehydration  . Cancer   . CKD (chronic kidney disease) stage 3, GFR 30-59 ml/min 08/01/2012  . Thyroid disease     Past Surgical History  Procedure Date  . Abdominal hysterectomy   . Kidney surgery   . Surgery for thyroid cancer   . Nephrectomy     No family history on file.  History  Substance Use Topics  . Smoking status: Never Smoker   . Smokeless tobacco: Not on file  . Alcohol Use: No    OB History    Grav Para Term Preterm Abortions TAB SAB Ect Mult Living                  Review of Systems At least 10pt or greater review of systems completed and are negative except where specified in the HPI.  Allergies  Penicillins; Tape; and Iohexol  Home Medications   Current Outpatient Rx  Name  Route  Sig  Dispense  Refill  . ASPIRIN EC 81 MG PO TBEC   Oral   Take 81 mg by mouth every morning.         Marland Kitchen CALCIUM CARBONATE 600 MG PO TABS   Oral   Take 600 mg by mouth 2 (two) times daily with a meal.         . LEVOTHYROXINE SODIUM 88 MCG PO TABS   Oral   Take 88 mcg by mouth every morning.         Marland Kitchen METOPROLOL SUCCINATE ER  25 MG PO TB24   Oral   Take 1 tablet (25 mg total) by mouth daily. New medicine for your blood pressure and for your heart.   30 tablet   2     Triage Vitals: BP 143/76  Pulse 65  Temp 97.8 F (36.6 C) (Oral)  Resp 18  SpO2 98%  Physical Exam  Nursing notes reviewed.  Electronic medical record reviewed. VITAL SIGNS:   Filed Vitals:   08/18/12 0751  BP: 143/76  Pulse: 65  Temp: 97.8 F (36.6 C)  TempSrc: Oral  Resp: 18  SpO2: 98%   CONSTITUTIONAL: Awake, oriented, appears non-toxic HENT: Atraumatic, normocephalic, oral mucosa pink and moist, airway patent. Nares patent without drainage. External ears normal. EYES: Conjunctiva clear, EOMI, PERRLA NECK: Trachea midline, non-tender, supple CARDIOVASCULAR: Normal heart rate, Normal rhythm, No murmurs, rubs, gallops PULMONARY/CHEST:  Clear to auscultation, no rhonchi, wheezes, or rales. Symmetrical breath sounds. Non-tender. ABDOMINAL: Non-distended, soft, non-tender - no rebound or guarding.  BS normal. NEUROLOGIC: Non-focal, moving all four extremities, no gross sensory or motor deficits. EXTREMITIES: No clubbing, cyanosis, or edema SKIN: Warm, Dry, No erythema, No rash  ED Course  Procedures (including critical care time)  Date: 08/18/2012  Rate: 56  Rhythm: normal sinus rhythm  QRS Axis: normal  Intervals: normal  ST/T Wave abnormalities: Inverted T waves in high lateral leads V5 V6 - these were seen previously on EKG dated 08/02/2012.  Conduction Disutrbances: Right bundle branch block  Narrative Interpretation: Nonischemic EKG, no new changes, persistent right bundle branch block    DIAGNOSTIC STUDIES: Oxygen Saturation is 98% on room air, normal by my interpretation.    COORDINATION OF CARE: 8:08 AM- Patient informed of current plan for treatment and evaluation and agrees with plan at this time.  11:18 AM- Discussed case with Dr. Margo Aye, patient's PCP.  Results for orders placed during the hospital encounter of 08/18/12  CBC      Component Value Range   WBC 6.3  4.0 - 10.5 K/uL   RBC 4.33  3.87 - 5.11 MIL/uL   Hemoglobin 13.6  12.0 - 15.0 g/dL   HCT 16.1  09.6 - 04.5 %   MCV 93.3  78.0 - 100.0 fL   MCH 31.4  26.0 - 34.0 pg   MCHC 33.7  30.0 - 36.0 g/dL   RDW 40.9  81.1 - 91.4 %   Platelets 387  150 - 400 K/uL  BASIC METABOLIC PANEL      Component Value Range   Sodium 134 (*) 135 - 145 mEq/L   Potassium 4.0  3.5 - 5.1 mEq/L   Chloride 98  96 - 112 mEq/L   CO2 27  19 - 32 mEq/L   Glucose, Bld 98  70 - 99 mg/dL   BUN 12  6 - 23 mg/dL   Creatinine, Ser 7.82  0.50 - 1.10 mg/dL   Calcium 9.2  8.4 - 95.6 mg/dL   GFR calc non Af Amer 47 (*) >90 mL/min   GFR calc Af Amer 55 (*) >90 mL/min  TROPONIN I      Component Value Range   Troponin I <0.30  <0.30 ng/mL    Ct Chest Wo Contrast  08/18/2012   *RADIOLOGY REPORT*  Clinical Data: Chest pain, nonproductive cough, history of renal cancer status post right nephrectomy, thyroid cancer  CT CHEST WITHOUT CONTRAST  Technique:  Multidetector CT imaging of the chest was performed following the standard protocol without IV contrast.  Comparison: PET  CT dated 07/18/2009.  CT chest dated 05/25/2009.  Findings: Numerous small bilateral tree-in-bud nodules, likely infectious/inflammatory.  Additionally, there are scattered slightly larger nodules, predominately in the right lung, including: --7 mm nodule along the right major fissure (series 3/image 24), unchanged --5 mm nodule in the right middle lobe (series 3/image 25), previously 9 mm --9 x 7 mm nodule in the right upper lobe (series 3/image 30), new --11 x 11 mm subpleural nodule in the right lower lobe (series 3/image 34), previously 8 x 7 mm  Moderate underlying centrilobular and paraseptal emphysematous changes.  Bronchiectasis in the right middle lobe, lingula, and left lower lobe. No pleural effusion or pneumothorax.  Prior thyroidectomy.  The heart is normal in size.  No pericardial effusion.  Coronary atherosclerosis.  Atherosclerotic calcifications of the aortic arch.  Small mediastinal lymph nodes which do not meet pathologic CT size criteria.  No suspicious axillary lymphadenopathy.  Visualized upper abdomen is unremarkable.  Mild degenerative changes of the visualized thoracolumbar spine.  IMPRESSION: Numerous small bilateral tree-in-bud nodules/opacities with scattered bronchiectasis.  This appearance suggests atypical mycobacterial infection such as MAI.  Multiple scattered slightly larger nodules, predominantly in the right lung, some of which are mildly increased or decreased when compared to 2010.  This appearance is also favored to be infectious/inflammatory rather than metastases.  Dominant nodule measures 11 mm in the right lower lobe, previously 8 mm.  Given underlying emphysematous changes,  consider follow-up CT chest in 3-6 months.   Original Report Authenticated By: Charline Bills, M.D.    Dg Chest Port 1 View  08/18/2012  *RADIOLOGY REPORT*  Clinical Data: Chest pain.  Shortness of breath.  Cough.  PORTABLE CHEST - 1 VIEW  Comparison: 08/08/2012  Findings: Stable volume loss/scarring at the left lung base. Suspected enlarging pulmonary nodules include a 2.1 x 1.4 cm right mid lung nodule and a suspected 1.1 cm right upper lobe nodule.  Airway thickening is observed.  Chronic mild interstitial accentuation is noted, but is less striking than on the exam from 08/08/2012.  Dense atherosclerotic calcification of the aortic arch noted. The patient is rotated to the left on today's exam, resulting in reduced diagnostic sensitivity and specificity.   Cardiothoracic index 55%, within normal limits for technique.  Clips over the lower neck may be from prior thyroidectomy.  Emphysema noted.  IMPRESSION:  1.  Suspected enlarging right pulmonary nodules.  Malignancy is not excluded. Consider chest CT. 2.  Stable volume loss and scarring at the left lung base peripherally 3.  Airway thickening, suspicious for bronchitis or reactive airways. 4.  Atherosclerosis. 5.  Interstitial accentuation is less striking than on the prior exam. 6.  Emphysema.   Original Report Authenticated By: Gaylyn Rong, M.D.      1. Viral syndrome   2. Cough   3. Cough with hemoptysis   4. Chest pain, atypical       MDM  Carolyn Hughes is a 78 y.o. female has a PMH significant for RCC and Thyroid cancer.  She presents with sharp, atypical chest pain most suggestive of bronchitis or viral URI.  Pt hx taken in context of some episodes of mild hemoptysis cannot be overlooked, will obtain CXR, labs.  Will check Trop and EKG with pain also - my pretest propability is exceedingly low for ACS.  Likewise I think chest wall or URI cause of her sharp intermittent chest pain is most likely, such that her Well's score is  1  Labs unremarkable. CXR  does show change in nodules.  Will obtain CT chest without contrast d/t anaphylactoid Rxn to io donated dye.  CT of chest shows multiple changes from CT done 2010 - please refer to radiology read.  Discussed w/ Radiologist and reviewed myself.  Do not think there is indication for antibiotic therapy at this time.  MAI/MAC is acquired through environmental sources and not communicable - further testing can be done as OP to determine treatment.    Enlargement of suspicious nodule from 8-73mm as dicscussed with Radiology is likely a slow growing tumor of undertermined significance.  This can also be followed up as OP.  08/18/2012 11:18 AM Discussed with Dr. Margo Aye for following up CT results and recurrent chest pains.  Pt is stable, without complaints and would like to return home.  I explained the diagnosis and have given explicit precautions to return to the ER including increased hemoptysis or any other new or worsening symptoms. The patient understands and accepts the medical plan as it's been dictated and I have answered their questions. Discharge instructions concerning home care and prescriptions have been given.  The patient is STABLE and is discharged to home in good condition.  I personally performed the services described in this documentation, which was scribed in my presence. The recorded information has been reviewed and is accurate. Jones Skene, M.D.     Jones Skene, MD 08/18/12 2007

## 2012-08-20 ENCOUNTER — Emergency Department (HOSPITAL_COMMUNITY)
Admission: EM | Admit: 2012-08-20 | Discharge: 2012-08-21 | Disposition: A | Discharge status: Home / Self Care | Payer: Medicare Other | Attending: Emergency Medicine | Admitting: Emergency Medicine

## 2012-08-20 ENCOUNTER — Emergency Department (HOSPITAL_COMMUNITY): Payer: Medicare Other

## 2012-08-20 ENCOUNTER — Encounter (HOSPITAL_COMMUNITY): Payer: Self-pay | Admitting: Emergency Medicine

## 2012-08-20 DIAGNOSIS — E079 Disorder of thyroid, unspecified: Secondary | ICD-10-CM | POA: Insufficient documentation

## 2012-08-20 DIAGNOSIS — Z862 Personal history of diseases of the blood and blood-forming organs and certain disorders involving the immune mechanism: Secondary | ICD-10-CM | POA: Insufficient documentation

## 2012-08-20 DIAGNOSIS — I428 Other cardiomyopathies: Secondary | ICD-10-CM | POA: Insufficient documentation

## 2012-08-20 DIAGNOSIS — Z79899 Other long term (current) drug therapy: Secondary | ICD-10-CM | POA: Insufficient documentation

## 2012-08-20 DIAGNOSIS — Z859 Personal history of malignant neoplasm, unspecified: Secondary | ICD-10-CM | POA: Insufficient documentation

## 2012-08-20 DIAGNOSIS — R1110 Vomiting, unspecified: Secondary | ICD-10-CM

## 2012-08-20 DIAGNOSIS — N183 Chronic kidney disease, stage 3 unspecified: Secondary | ICD-10-CM | POA: Insufficient documentation

## 2012-08-20 DIAGNOSIS — R112 Nausea with vomiting, unspecified: Secondary | ICD-10-CM | POA: Insufficient documentation

## 2012-08-20 DIAGNOSIS — I129 Hypertensive chronic kidney disease with stage 1 through stage 4 chronic kidney disease, or unspecified chronic kidney disease: Secondary | ICD-10-CM | POA: Insufficient documentation

## 2012-08-20 DIAGNOSIS — Z7982 Long term (current) use of aspirin: Secondary | ICD-10-CM | POA: Insufficient documentation

## 2012-08-20 DIAGNOSIS — R197 Diarrhea, unspecified: Secondary | ICD-10-CM | POA: Insufficient documentation

## 2012-08-20 LAB — URINALYSIS, ROUTINE W REFLEX MICROSCOPIC
Ketones, ur: NEGATIVE mg/dL
Leukocytes, UA: NEGATIVE
Nitrite: NEGATIVE
pH: 5.5 (ref 5.0–8.0)

## 2012-08-20 LAB — COMPREHENSIVE METABOLIC PANEL
Alkaline Phosphatase: 63 U/L (ref 39–117)
BUN: 14 mg/dL (ref 6–23)
Calcium: 9.3 mg/dL (ref 8.4–10.5)
Creatinine, Ser: 1.05 mg/dL (ref 0.50–1.10)
GFR calc Af Amer: 56 mL/min — ABNORMAL LOW (ref >90)
Glucose, Bld: 139 mg/dL — ABNORMAL HIGH (ref 70–99)
Potassium: 4.4 mEq/L (ref 3.5–5.1)
Total Protein: 7.6 g/dL (ref 6.0–8.3)

## 2012-08-20 LAB — CBC WITH DIFFERENTIAL/PLATELET
Basophils Relative: 0 % (ref 0–1)
Eosinophils Absolute: 0.1 10*3/uL (ref 0.0–0.7)
Eosinophils Relative: 1 % (ref 0–5)
HCT: 44.1 % (ref 36.0–46.0)
Hemoglobin: 15 g/dL (ref 12.0–15.0)
Lymphs Abs: 0.4 10*3/uL — ABNORMAL LOW (ref 0.7–4.0)
MCH: 31.5 pg (ref 26.0–34.0)
MCHC: 34 g/dL (ref 30.0–36.0)
MCV: 92.6 fL (ref 78.0–100.0)
Monocytes Absolute: 0.3 10*3/uL (ref 0.1–1.0)
Monocytes Relative: 4 % (ref 3–12)
RBC: 4.76 MIL/uL (ref 3.87–5.11)

## 2012-08-20 LAB — URINE MICROSCOPIC-ADD ON

## 2012-08-20 MED ORDER — SODIUM CHLORIDE 0.9 % IV BOLUS (SEPSIS)
700.0000 mL | Freq: Once | INTRAVENOUS | Status: AC
  Administered 2012-08-20: 700 mL via INTRAVENOUS

## 2012-08-20 MED ORDER — ONDANSETRON HCL 4 MG/2ML IJ SOLN
4.0000 mg | Freq: Once | INTRAMUSCULAR | Status: AC
  Administered 2012-08-20: 4 mg via INTRAVENOUS
  Filled 2012-08-20: qty 2

## 2012-08-20 MED ORDER — SODIUM CHLORIDE 0.9 % IV SOLN
INTRAVENOUS | Status: DC
  Administered 2012-08-20: 17:00:00 via INTRAVENOUS

## 2012-08-20 MED ORDER — ONDANSETRON HCL 4 MG PO TABS: 4.0000 mg | ORAL_TABLET | Freq: Four times a day (QID) | ORAL | Status: DC

## 2012-08-20 NOTE — ED Notes (Signed)
Pt c/o N/V x 4 days. Pt was treated at [facility] 2 days ago for same symtoms.

## 2012-08-20 NOTE — ED Notes (Signed)
Patient ambulatory to restroom with assistance

## 2012-08-20 NOTE — ED Notes (Signed)
Patient returned from x-ray. Placed patient in upright position and gave meal tray. Patient states "I am starving. I haven't eaten in 24 hours." Patient currently sitting in bed eating meal at this time.

## 2012-08-20 NOTE — ED Provider Notes (Signed)
History   This chart was scribed for Ward Givens, MD scribed by Magnus Sinning. The patient was seen in room APA14/APA14 at  17:03  CSN: [id number]  Arrival date & time 08/20/12  1648    Chief Complaint  Patient presents with  . Nausea  . Emesis    (Consider location/radiation/quality/duration/timing/severity/associated sxs/prior treatment) Patient is a 78 y.o. female presenting with vomiting. The history is provided by the patient. No language interpreter was used.  Emesis   Carolyn Hughes is a 78 y.o. female who presents to the Emergency Department via ambulance complaining of intermittent moderate nausea with associated lower abd cramping pain,  4-5 episodes/day of clear appearing emesis,  4-5 episodes/day of clear appearing diarrhea, dizziness, lightheadedness, subjective fever, intermittent chills, occasional productive and non productive cough with yellow sputum and  occasional blood streaking the past 2-3 days but denies chest pain or shortness of breath.  The patient states she was seen in the ED two days ago for the same sxs, but says these sxs began 3 days ago. She reports that no one around her has been sick and she does not suspect anything she's eaten would cause symptoms. She also denies any recent abx use, and denies SOB, or CP.  Pt also reports her heat ran out this morning and when she called the gas company there was no answer, but she is having difficulty telling me which company she uses.    PCP: Dr. Catalina Pizza  Past Medical History  Diagnosis Date  . Hypertension   . Anemia 08/05/2012  . Hypertrophic obstructive cardiomyopathy 09/09/2006    Qualifier: Diagnosis of  By: Jen Mow MD, Wynona Canes    . Syncope and collapse 08/01/2012    Secondary to dehydration  . Cancer   . CKD (chronic kidney disease) stage 3, GFR 30-59 ml/min 08/01/2012  . Thyroid disease     Past Surgical History  Procedure Date  . Abdominal hysterectomy   . Kidney surgery   . Surgery for  thyroid cancer   . Nephrectomy     History reviewed. No pertinent family history.  History  Substance Use Topics  . Smoking status: Never Smoker   . Smokeless tobacco: Not on file  . Alcohol Use: No  The patient lives alone, she states she has "outlived my family" Ambulates with a cane and walker.  The patient does not smoke  Review of Systems  Gastrointestinal: Positive for vomiting.  All other systems reviewed and are negative.  10 Systems reviewed and are negative for acute change except as noted in the HPI. Allergies  Penicillins; Tape; and Iohexol  Home Medications   Current Outpatient Rx  Name  Route  Sig  Dispense  Refill  . ASPIRIN EC 81 MG PO TBEC   Oral   Take 81 mg by mouth every morning.         Marland Kitchen CALCIUM CARBONATE 600 MG PO TABS   Oral   Take 600 mg by mouth 2 (two) times daily with a meal.         . CALCIUM 600 + D PO   Oral   Take 1 tablet by mouth 2 (two) times daily.         . IRBESARTAN-HYDROCHLOROTHIAZIDE 150-12.5 MG PO TABS   Oral   Take 1 tablet by mouth daily.         Marland Kitchen LEVOTHYROXINE SODIUM 88 MCG PO TABS   Oral   Take 88 mcg by mouth every morning.         Marland Kitchen  METOPROLOL SUCCINATE ER 25 MG PO TB24   Oral   Take 1 tablet (25 mg total) by mouth daily. New medicine for your blood pressure and for your heart.   30 tablet   2     BP 131/85  Pulse 98  Temp 97.9 F (36.6 C) (Oral)  Resp 16  Ht 5\' 3"  (1.6 m)  Wt 170 lb (77.111 kg)  BMI 30.11 kg/m2  SpO2 96%  Vital signs normal    Physical Exam  Nursing note and vitals reviewed. Constitutional: She is oriented to person, place, and time. She appears well-developed and well-nourished. No distress.  HENT:  Head: Normocephalic and atraumatic.  Right Ear: External ear normal.  Left Ear: External ear normal.  Nose: Nose normal.       The tongue is mildly dry  Eyes: Conjunctivae normal and EOM are normal. Pupils are equal, round, and reactive to light.  Neck: Normal range  of motion. Neck supple. No tracheal deviation present.  Cardiovascular: Normal rate and regular rhythm.  Exam reveals no gallop and no friction rub.   Murmur heard.      Harsh blowing systolic murmur with the best at the LUSB.   Pulmonary/Chest: Effort normal and breath sounds normal. No respiratory distress. She has no wheezes. She has no rales. She exhibits no tenderness.  Abdominal: She exhibits no distension. There is tenderness. There is no rebound and no guarding.       Very active bowel sounds. Mildly tender diffusely across the lower abdomen  Musculoskeletal: Normal range of motion. She exhibits edema.       Mild diffuse non pitting swelling of right foot and ankle without localizing pain.  Neurological: She is alert and oriented to person, place, and time. No sensory deficit.  Skin: Skin is dry.  Psychiatric: She has a normal mood and affect. Her behavior is normal.    ED Course  Procedures (including critical care time)   Medications  0.9 %  sodium chloride infusion (  Intravenous New Bag/Given 08/20/12 1718)  ondansetron (ZOFRAN) 4 MG tablet (not administered)  sodium chloride 0.9 % bolus 700 mL (0 mL Intravenous Stopped 08/20/12 1858)  ondansetron (ZOFRAN) injection 4 mg (4 mg Intravenous Given 08/20/12 1717)    DIAGNOSTIC STUDIES: Oxygen Saturation is 96% on room air, normal by my interpretation.    COORDINATION OF CARE: 17:08: Physical exam performed.   18:23 nurse reports they contacted her oil company and they just filled her tank up 2 weeks ago, when he told the patient she now states she can't go home because she hasn't had any food in 24 hrs.   20:00 patient is smiling in her room in no distress. She has eaten a dinner tray with no difficulty. She states she feels fine. She's had no vomiting or diarrhea during her during her ED visit. Discussed going home. Patient again states her house was cold. I pointed out they talked to the gas company she does have oil for  her heater. No coughing has been noted when walking by her room or during her exam.   Results for orders placed during the hospital encounter of 08/20/12  CBC WITH DIFFERENTIAL      Component Value Range   WBC 8.4  4.0 - 10.5 K/uL   RBC 4.76  3.87 - 5.11 MIL/uL   Hemoglobin 15.0  12.0 - 15.0 g/dL   HCT 16.1  09.6 - 04.5 %   MCV 92.6  78.0 - 100.0 fL  MCH 31.5  26.0 - 34.0 pg   MCHC 34.0  30.0 - 36.0 g/dL   RDW 16.1  09.6 - 04.5 %   Platelets 372  150 - 400 K/uL   Neutrophils Relative 90 (*) 43 - 77 %   Neutro Abs 7.6  1.7 - 7.7 K/uL   Lymphocytes Relative 5 (*) 12 - 46 %   Lymphs Abs 0.4 (*) 0.7 - 4.0 K/uL   Monocytes Relative 4  3 - 12 %   Monocytes Absolute 0.3  0.1 - 1.0 K/uL   Eosinophils Relative 1  0 - 5 %   Eosinophils Absolute 0.1  0.0 - 0.7 K/uL   Basophils Relative 0  0 - 1 %   Basophils Absolute 0.0  0.0 - 0.1 K/uL  COMPREHENSIVE METABOLIC PANEL      Component Value Range   Sodium 138  135 - 145 mEq/L   Potassium 4.4  3.5 - 5.1 mEq/L   Chloride 99  96 - 112 mEq/L   CO2 27  19 - 32 mEq/L   Glucose, Bld 139 (*) 70 - 99 mg/dL   BUN 14  6 - 23 mg/dL   Creatinine, Ser 4.09  0.50 - 1.10 mg/dL   Calcium 9.3  8.4 - 81.1 mg/dL   Total Protein 7.6  6.0 - 8.3 g/dL   Albumin 3.9  3.5 - 5.2 g/dL   AST 18  0 - 37 U/L   ALT 15  0 - 35 U/L   Alkaline Phosphatase 63  39 - 117 U/L   Total Bilirubin 0.5  0.3 - 1.2 mg/dL   GFR calc non Af Amer 48 (*) >90 mL/min   GFR calc Af Amer 56 (*) >90 mL/min  URINALYSIS, ROUTINE W REFLEX MICROSCOPIC      Component Value Range   Color, Urine YELLOW  YELLOW   APPearance CLEAR  CLEAR   Specific Gravity, Urine >1.030 (*) 1.005 - 1.030   pH 5.5  5.0 - 8.0   Glucose, UA NEGATIVE  NEGATIVE mg/dL   Hgb urine dipstick SMALL (*) NEGATIVE   Bilirubin Urine NEGATIVE  NEGATIVE   Ketones, ur NEGATIVE  NEGATIVE mg/dL   Protein, ur NEGATIVE  NEGATIVE mg/dL   Urobilinogen, UA 0.2  0.0 - 1.0 mg/dL   Nitrite NEGATIVE  NEGATIVE   Leukocytes, UA  NEGATIVE  NEGATIVE  URINE MICROSCOPIC-ADD ON      Component Value Range   Squamous Epithelial / LPF FEW (*) RARE   WBC, UA 0-2  <3 WBC/hpf   RBC / HPF 3-6  <3 RBC/hpf   Bacteria, UA FEW (*) RARE   Laboratory interpretation all normal except mild hyperglycemia, concentrated urine, no uti   Dg Chest 2 View  08/20/2012  *RADIOLOGY REPORT*  Clinical Data: Cough.  Intermittent fever.  CHEST - 2 VIEW  Comparison: Chest x-ray 08/04/2012.  Findings: Mild elevation of the left hemidiaphragm is unchanged. Chronic bibasilar scarring is unchanged (left greater than right). Diffuse bronchial wall thickening with scattered areas of peripheral predominant micronodularity, unchanged compared to prior examinations, and likely reflects chronic mucoid impaction within terminal bronchioles (better demonstrated on recent CT examination).  No definite new acute consolidative airspace disease.  No pleural effusions.  No evidence of pulmonary edema. Heart size is normal.  Mediastinal contours are unremarkable. Atherosclerosis in the thoracic aorta.  IMPRESSION: 1.  Chronic changes in the lungs redemonstrated, as above, without radiographic evidence of acute cardiopulmonary disease. 2.  Atherosclerosis.   Original Report  Authenticated By: Trudie Reed, M.D.      08/01/2012 RIGHT FOOT COMPLETE - 3+ VIEW  Comparison: None.  Findings: Mild degenerative changes at the first MTP joint. Soft  tissue swelling over the dorsum of the foot. No acute bony  abnormality. No acute fracture, subluxation or dislocation.  IMPRESSION:  No acute bony abnormality.  Original Report Authenticated By: Charlett Nose, M.D.    1. Vomiting and diarrhea    New Prescriptions   ONDANSETRON (ZOFRAN) 4 MG TABLET    Take 1 tablet (4 mg total) by mouth every 6 (six) hours.   Plan discharge  Devoria Albe, MD, FACEP    MDM   I personally performed the services described in this documentation, which was scribed in my presence. The  recorded information has been reviewed and considered.  Devoria Albe, MD, Armando Gang        Ward Givens, MD 08/20/12 2008

## 2012-08-21 MED ORDER — ONDANSETRON 8 MG PO TBDP
8.0000 mg | ORAL_TABLET | Freq: Once | ORAL | Status: AC
  Administered 2012-08-21: 8 mg via ORAL
  Filled 2012-08-21: qty 1

## 2012-08-21 NOTE — ED Notes (Addendum)
[facility] called back, [place]. Made aware of pt situation. Advised that pt has been showing mild signs of dementia. Advised that pt told prior shift that she has had no heat in house x 2 days and has no food. Two of our employees met with [place] Deputy at her house to attempt heat but heat was already on and house was warm. Pt also had a lot of food in house.

## 2012-08-21 NOTE — ED Notes (Signed)
Patient states she does not have any heat in her house. States that "my heat went out on Tuesday and I don't know what's wrong with it." Patient states she does not have any family in the area and is unable to call for assistance with her heat tonight. States "I will try to check on it tomorrow." Patient left in room to wait til morning to call for ride home.

## 2012-08-21 NOTE — ED Notes (Addendum)
Patient currently lying in bed asleep at this time. Equal rise and fall of chest noted.

## 2012-08-21 NOTE — ED Notes (Signed)
Pt appears slightly forgetful. Gave breakfast tray, pt looked at everything on tray and stated " that looks good". Pt wanted to go to restroom first. Carolyn Hughes pt to bathroom and when got back pt stated "Oh, they done brought my breakfast".

## 2012-08-21 NOTE — ED Notes (Signed)
Multiple attempts to contact associates to assist pt with transportation home.  Pt reporting she doesn't have heat in her home either.  Pt will be able to contact repairman tomorrow.  Pt to sleep in department till morning, and will be transported home.

## 2012-08-22 NOTE — ED Notes (Signed)
Patient says she did not get a Rx for Zofran on 08/20/2012--says she only got one sheet of paper.  [facility] denies getting e-script for Zofran on 08/20/2012.  Zofran 4mg -take one tablet by mouth every 6 hours-quanity six called in to [facility] by Ree Shay, RN from Dr. Clarene Duke.  Ree Shay, RN

## 2012-10-06 ENCOUNTER — Encounter (HOSPITAL_COMMUNITY): Payer: Self-pay | Admitting: *Deleted

## 2012-10-06 ENCOUNTER — Emergency Department (HOSPITAL_COMMUNITY)
Admission: EM | Admit: 2012-10-06 | Discharge: 2012-10-06 | Disposition: A | Discharge status: Home / Self Care | Payer: Medicare Other | Attending: Emergency Medicine | Admitting: Emergency Medicine

## 2012-10-06 ENCOUNTER — Emergency Department (HOSPITAL_COMMUNITY): Payer: Medicare Other

## 2012-10-06 DIAGNOSIS — R1110 Vomiting, unspecified: Secondary | ICD-10-CM

## 2012-10-06 DIAGNOSIS — R059 Cough, unspecified: Secondary | ICD-10-CM | POA: Insufficient documentation

## 2012-10-06 DIAGNOSIS — R509 Fever, unspecified: Secondary | ICD-10-CM | POA: Insufficient documentation

## 2012-10-06 DIAGNOSIS — R05 Cough: Secondary | ICD-10-CM

## 2012-10-06 DIAGNOSIS — R197 Diarrhea, unspecified: Secondary | ICD-10-CM | POA: Insufficient documentation

## 2012-10-06 DIAGNOSIS — Z859 Personal history of malignant neoplasm, unspecified: Secondary | ICD-10-CM | POA: Insufficient documentation

## 2012-10-06 DIAGNOSIS — R112 Nausea with vomiting, unspecified: Secondary | ICD-10-CM | POA: Insufficient documentation

## 2012-10-06 DIAGNOSIS — Z7982 Long term (current) use of aspirin: Secondary | ICD-10-CM | POA: Insufficient documentation

## 2012-10-06 DIAGNOSIS — R918 Other nonspecific abnormal finding of lung field: Secondary | ICD-10-CM

## 2012-10-06 DIAGNOSIS — N183 Chronic kidney disease, stage 3 unspecified: Secondary | ICD-10-CM | POA: Insufficient documentation

## 2012-10-06 DIAGNOSIS — Z862 Personal history of diseases of the blood and blood-forming organs and certain disorders involving the immune mechanism: Secondary | ICD-10-CM | POA: Insufficient documentation

## 2012-10-06 DIAGNOSIS — Z8679 Personal history of other diseases of the circulatory system: Secondary | ICD-10-CM | POA: Insufficient documentation

## 2012-10-06 DIAGNOSIS — Z79899 Other long term (current) drug therapy: Secondary | ICD-10-CM | POA: Insufficient documentation

## 2012-10-06 DIAGNOSIS — E079 Disorder of thyroid, unspecified: Secondary | ICD-10-CM | POA: Insufficient documentation

## 2012-10-06 DIAGNOSIS — R109 Unspecified abdominal pain: Secondary | ICD-10-CM | POA: Insufficient documentation

## 2012-10-06 DIAGNOSIS — E860 Dehydration: Secondary | ICD-10-CM

## 2012-10-06 DIAGNOSIS — R911 Solitary pulmonary nodule: Secondary | ICD-10-CM | POA: Insufficient documentation

## 2012-10-06 DIAGNOSIS — I10 Essential (primary) hypertension: Secondary | ICD-10-CM | POA: Insufficient documentation

## 2012-10-06 LAB — COMPREHENSIVE METABOLIC PANEL
Albumin: 3.3 g/dL — ABNORMAL LOW (ref 3.5–5.2)
BUN: 13 mg/dL (ref 6–23)
Creatinine, Ser: 1.17 mg/dL — ABNORMAL HIGH (ref 0.50–1.10)
Total Protein: 6.7 g/dL (ref 6.0–8.3)

## 2012-10-06 LAB — CBC WITH DIFFERENTIAL/PLATELET
Basophils Relative: 1 % (ref 0–1)
Eosinophils Absolute: 0.2 10*3/uL (ref 0.0–0.7)
HCT: 42 % (ref 36.0–46.0)
Hemoglobin: 13.8 g/dL (ref 12.0–15.0)
MCH: 31 pg (ref 26.0–34.0)
MCHC: 32.9 g/dL (ref 30.0–36.0)
Monocytes Absolute: 0.5 10*3/uL (ref 0.1–1.0)
Monocytes Relative: 6 % (ref 3–12)
Neutrophils Relative %: 80 % — ABNORMAL HIGH (ref 43–77)

## 2012-10-06 LAB — LIPASE, BLOOD: Lipase: 36 U/L (ref 11–59)

## 2012-10-06 LAB — LACTIC ACID, PLASMA: Lactic Acid, Venous: 1.7 mmol/L (ref 0.5–2.2)

## 2012-10-06 MED ORDER — SODIUM CHLORIDE 0.9 % IV SOLN
Freq: Once | INTRAVENOUS | Status: AC
  Administered 2012-10-06: 09:00:00 via INTRAVENOUS

## 2012-10-06 MED ORDER — ONDANSETRON HCL 4 MG/2ML IJ SOLN
4.0000 mg | Freq: Once | INTRAMUSCULAR | Status: AC
  Administered 2012-10-06: 4 mg via INTRAVENOUS
  Filled 2012-10-06: qty 2

## 2012-10-06 MED ORDER — SODIUM CHLORIDE 0.9 % IV BOLUS (SEPSIS)
500.0000 mL | Freq: Once | INTRAVENOUS | Status: AC
  Administered 2012-10-06: 500 mL via INTRAVENOUS

## 2012-10-06 NOTE — ED Notes (Signed)
Pt returned from xray

## 2012-10-06 NOTE — ED Provider Notes (Signed)
History  This chart was scribed for Carolyn Gaskins, MD by Ardeen Jourdain, ED Scribe. This patient was seen in room APA04/APA04 and the patient's care was started at 0823.  CSN: [id number]  Arrival date & time 10/06/12  0820   First MD Initiated Contact with Patient 10/06/12 [phone]      Chief Complaint  Patient presents with  . Emesis     Patient is a 79 y.o. female presenting with vomiting and diarrhea.  Emesis  This is a new problem. The current episode started more than 2 days ago. The problem occurs 2 to 4 times per day. The problem has not changed since onset.The emesis has an appearance of stomach contents. The maximum temperature recorded prior to her arrival was 100 to 100.9 F. The fever has been present for 1 to 2 days. Associated symptoms include abdominal pain, cough, diarrhea and a fever. Pertinent negatives include no chills.  Diarrhea The primary symptoms include fever, abdominal pain, nausea, vomiting and diarrhea. Primary symptoms do not include melena.  The illness does not include chills.   Carolyn Hughes is a 79 y.o. female who presents to the Emergency Department complaining of emesis and diarrhea. She states she had 4 episodes of emesis and 4 of diarrhea a day for the past 2 days. She is also c/o a productive cough. She denies any bloody sputum.    Past Medical History  Diagnosis Date  . Hypertension   . Anemia 08/05/2012  . Hypertrophic obstructive cardiomyopathy 09/09/2006    Qualifier: Diagnosis of  By: Jen Mow MD, Wynona Canes    . Syncope and collapse 08/01/2012    Secondary to dehydration  . Cancer   . CKD (chronic kidney disease) stage 3, GFR 30-59 ml/min 08/01/2012  . Thyroid disease     Past Surgical History  Procedure Date  . Abdominal hysterectomy   . Kidney surgery   . Surgery for thyroid cancer   . Nephrectomy     No family history on file.  History  Substance Use Topics  . Smoking status: Never Smoker   . Smokeless tobacco: Not on file    . Alcohol Use: No   No ob history available.  Review of Systems  Constitutional: Positive for fever. Negative for chills.  Respiratory: Positive for cough. Negative for shortness of breath.   Cardiovascular: Negative for chest pain.  Gastrointestinal: Positive for nausea, vomiting, abdominal pain and diarrhea. Negative for melena.  Neurological: Negative for weakness and numbness.  All other systems reviewed and are negative.    Allergies  Penicillins; Tape; and Iohexol  Home Medications   Current Outpatient Rx  Name  Route  Sig  Dispense  Refill  . ASPIRIN EC 81 MG PO TBEC   Oral   Take 81 mg by mouth every morning.         Marland Kitchen CALCIUM CARBONATE 600 MG PO TABS   Oral   Take 600 mg by mouth 2 (two) times daily with a meal.         . CALCIUM 600 + D PO   Oral   Take 1 tablet by mouth 2 (two) times daily.         . IRBESARTAN-HYDROCHLOROTHIAZIDE 150-12.5 MG PO TABS   Oral   Take 1 tablet by mouth daily.         Marland Kitchen LEVOTHYROXINE SODIUM 88 MCG PO TABS   Oral   Take 88 mcg by mouth every morning.         Marland Kitchen  METOPROLOL SUCCINATE ER 25 MG PO TB24   Oral   Take 1 tablet (25 mg total) by mouth daily. New medicine for your blood pressure and for your heart.   30 tablet   2   . ONDANSETRON HCL 4 MG PO TABS   Oral   Take 1 tablet (4 mg total) by mouth every 6 (six) hours.   6 tablet   0     Triage Vitals: BP 111/87  Pulse 87  Temp 98.1 F (36.7 C) (Oral)  Resp 24  Ht 5\' 3"  (1.6 m)  Wt 170 lb (77.111 kg)  BMI 30.11 kg/m2  SpO2 99%  Physical Exam  CONSTITUTIONAL: Well developed/well nourished HEAD AND FACE: Normocephalic/atraumatic EYES: EOMI/PERRL, no icterus ENMT: Mucous membranes dry  NECK: supple no meningeal signs SPINE:entire spine nontender CV: S1/S2 noted, murmer noted LUNGS: Coarse breath sounds in left base  ABDOMEN: soft, nontender, no rebound or guarding GU:no cva tenderness NEURO: Pt is awake/alert, moves all  extremitiesx4 EXTREMITIES: pulses normal, full ROM SKIN: warm, color normal PSYCH: no abnormalities of mood noted   ED Course  Procedures DIAGNOSTIC STUDIES: Oxygen Saturation is 99% on room air, normal by my interpretation.    COORDINATION OF CARE:  8:32 AM: Discussed treatment plan which includes CBC and nausea medication with pt at bedside and pt agreed to plan.   9:38 AM Pt well appearing, reports improved, no distress, labs/imaging reassuring.  No abd pain/dysuria reported.  Stable for d/c   MDM  Nursing notes including past medical history and social history reviewed and considered in documentation xrays reviewed and considered Labs/vital reviewed and considered Previous records reviewed and considered      Date: 10/06/2012  Rate: 68  Rhythm: normal sinus rhythm  QRS Axis: left  Intervals: normal  ST/T Wave abnormalities: nonspecific ST changes  Conduction Disutrbances:nonspecific intraventricular conduction delay  Narrative Interpretation:   Old EKG Reviewed: unchanged    I personally performed the services described in this documentation, which was scribed in my presence. The recorded information has been reviewed and is accurate.      Carolyn Gaskins, MD 10/06/12 [phone]

## 2012-10-06 NOTE — ED Notes (Signed)
Pt stated she would like to be placed in assisted living. Dr Scharlene Gloss office called and they stated they are already working on that. Pt aware. Pt ride in PT, pt will be transferred via w/c to PT

## 2012-10-06 NOTE — ED Notes (Signed)
Pt states N/V/D x 2 days. Denies vomiting or diarrhea today but states she has been heaving this morning. States generalized abdominal pain. Pt also states, " i feel like i'm going to pass out sometimes." productive cough.

## 2013-04-01 ENCOUNTER — Emergency Department (HOSPITAL_COMMUNITY)
Admission: EM | Admit: 2013-04-01 | Discharge: 2013-04-01 | Disposition: A | Discharge status: Home / Self Care | Payer: Medicare Other | Attending: Emergency Medicine | Admitting: Emergency Medicine

## 2013-04-01 ENCOUNTER — Emergency Department (HOSPITAL_COMMUNITY): Payer: Medicare Other

## 2013-04-01 ENCOUNTER — Encounter (HOSPITAL_COMMUNITY): Payer: Self-pay | Admitting: *Deleted

## 2013-04-01 DIAGNOSIS — I129 Hypertensive chronic kidney disease with stage 1 through stage 4 chronic kidney disease, or unspecified chronic kidney disease: Secondary | ICD-10-CM | POA: Insufficient documentation

## 2013-04-01 DIAGNOSIS — Z79899 Other long term (current) drug therapy: Secondary | ICD-10-CM | POA: Insufficient documentation

## 2013-04-01 DIAGNOSIS — M549 Dorsalgia, unspecified: Secondary | ICD-10-CM | POA: Insufficient documentation

## 2013-04-01 DIAGNOSIS — Z859 Personal history of malignant neoplasm, unspecified: Secondary | ICD-10-CM | POA: Insufficient documentation

## 2013-04-01 DIAGNOSIS — R059 Cough, unspecified: Secondary | ICD-10-CM | POA: Insufficient documentation

## 2013-04-01 DIAGNOSIS — Z862 Personal history of diseases of the blood and blood-forming organs and certain disorders involving the immune mechanism: Secondary | ICD-10-CM | POA: Insufficient documentation

## 2013-04-01 DIAGNOSIS — R109 Unspecified abdominal pain: Secondary | ICD-10-CM | POA: Insufficient documentation

## 2013-04-01 DIAGNOSIS — Z8679 Personal history of other diseases of the circulatory system: Secondary | ICD-10-CM | POA: Insufficient documentation

## 2013-04-01 DIAGNOSIS — E079 Disorder of thyroid, unspecified: Secondary | ICD-10-CM | POA: Insufficient documentation

## 2013-04-01 DIAGNOSIS — R509 Fever, unspecified: Secondary | ICD-10-CM | POA: Insufficient documentation

## 2013-04-01 DIAGNOSIS — R609 Edema, unspecified: Secondary | ICD-10-CM | POA: Insufficient documentation

## 2013-04-01 DIAGNOSIS — N183 Chronic kidney disease, stage 3 unspecified: Secondary | ICD-10-CM | POA: Insufficient documentation

## 2013-04-01 DIAGNOSIS — R110 Nausea: Secondary | ICD-10-CM | POA: Insufficient documentation

## 2013-04-01 DIAGNOSIS — N390 Urinary tract infection, site not specified: Secondary | ICD-10-CM | POA: Insufficient documentation

## 2013-04-01 DIAGNOSIS — R05 Cough: Secondary | ICD-10-CM | POA: Insufficient documentation

## 2013-04-01 DIAGNOSIS — Z880 Allergy status to penicillin: Secondary | ICD-10-CM | POA: Insufficient documentation

## 2013-04-01 LAB — BASIC METABOLIC PANEL
Chloride: 92 mEq/L — ABNORMAL LOW (ref 96–112)
GFR calc Af Amer: 38 mL/min — ABNORMAL LOW (ref >90)
Potassium: 4.1 mEq/L (ref 3.5–5.1)
Sodium: 129 mEq/L — ABNORMAL LOW (ref 135–145)

## 2013-04-01 LAB — CBC WITH DIFFERENTIAL/PLATELET
Basophils Absolute: 0 10*3/uL (ref 0.0–0.1)
Basophils Relative: 0 % (ref 0–1)
MCHC: 34.2 g/dL (ref 30.0–36.0)
Neutro Abs: 6.6 10*3/uL (ref 1.7–7.7)
Neutrophils Relative %: 77 % (ref 43–77)
RDW: 13.1 % (ref 11.5–15.5)

## 2013-04-01 LAB — URINALYSIS, ROUTINE W REFLEX MICROSCOPIC
Bilirubin Urine: NEGATIVE
Hgb urine dipstick: NEGATIVE
Specific Gravity, Urine: 1.02 (ref 1.005–1.030)
Urobilinogen, UA: 0.2 mg/dL (ref 0.0–1.0)

## 2013-04-01 MED ORDER — ACETAMINOPHEN 500 MG PO TABS
1000.0000 mg | ORAL_TABLET | Freq: Once | ORAL | Status: AC
  Administered 2013-04-01: 1000 mg via ORAL
  Filled 2013-04-01: qty 2

## 2013-04-01 NOTE — ED Notes (Signed)
Discharge instructions reviewed with pt, questions answered. Pt verbalized understanding.  

## 2013-04-01 NOTE — ED Notes (Addendum)
Pt seen in [place] for UTI on 7/2, pt reports pain worse, co lower abdominal pain, and flank pain bilateral. Pt resident at [facility].

## 2013-04-01 NOTE — ED Notes (Signed)
Daughter here to pick up patient.  Patient ambulatory with assistance of cane; discharged back to [facility] in good condition.

## 2013-04-01 NOTE — ED Provider Notes (Signed)
History    This chart was scribed for Flint Melter, MD by Quintella Reichert, ED scribe.  This patient was seen in room APA05/APA05 and the patient's care was started at 3:23 PM.  CSN: [id number]  Arrival date & time 04/01/13  1519    Chief Complaint  Patient presents with  . Urinary Tract Infection  . Abdominal Pain  . Flank Pain    The history is provided by the patient. No language interpreter was used.    HPI Comments: Carolyn Hughes is a 79 y.o. female with h/o HTN, CKD, and recent UTI diagnosis brought by EMS from [facility] to the Emergency Department complaining of constant, progressively-worsening, moderate lower abdominal pain with accompanying lower back pain and nausea.  Abdominal pain is localized across the lower abdomen and back pain is localized to the center of the lower back.  Pt states that she has not been eating due to nausea and has been dry heaving, but she denies emesis.  Her last BM was yesterday and was normal.  Pt also states that she had a low-grade fever yesterday which has since resolved.  In addition she complains of a mild intermittent dry cough.  She denies CP.  Pt was seen in [place] on 7/2 with similar pain.  At that visit she was diagnosed with a UTI and placed on Septra.  She states that her pain has continued to grow more severe since then.  Pt has h/o 2 kidney stones   She has not seen her PCP over her current symptoms.   Past Medical History  Diagnosis Date  . Hypertension   . Anemia 08/05/2012  . Hypertrophic obstructive cardiomyopathy(425.11) 09/09/2006    Qualifier: Diagnosis of  By: Jen Mow MD, Christine    . Syncope and collapse 08/01/2012    Secondary to dehydration  . Cancer   . CKD (chronic kidney disease) stage 3, GFR 30-59 ml/min 08/01/2012  . Thyroid disease    Past Surgical History  Procedure Laterality Date  . Abdominal hysterectomy    . Kidney surgery    . Surgery for thyroid cancer    . Nephrectomy Right     cancer    History reviewed. No pertinent family history. History  Substance Use Topics  . Smoking status: Never Smoker   . Smokeless tobacco: Not on file  . Alcohol Use: No   OB History   Grav Para Term Preterm Abortions TAB SAB Ect Mult Living                   Review of Systems  Constitutional: Positive for fever.  Respiratory: Positive for cough.   Cardiovascular: Negative for chest pain.  Gastrointestinal: Positive for nausea and abdominal pain. Negative for vomiting, diarrhea and constipation.  Musculoskeletal: Positive for back pain.  All other systems reviewed and are negative.      Allergies  Penicillins; Tape; and Iohexol  Home Medications   Current Outpatient Rx  Name  Route  Sig  Dispense  Refill  . acetaminophen (TYLENOL) 500 MG tablet   Oral   Take 500 mg by mouth every 6 (six) hours as needed for pain.         Marland Kitchen irbesartan-hydrochlorothiazide (AVALIDE) 150-12.5 MG per tablet   Oral   Take 1 tablet by mouth daily.         Marland Kitchen levothyroxine (SYNTHROID, LEVOTHROID) 88 MCG tablet   Oral   Take 88 mcg by mouth every morning.         Marland Kitchen  omeprazole (PRILOSEC) 20 MG capsule   Oral   Take 20 mg by mouth daily.         Marland Kitchen sulfamethoxazole-trimethoprim (BACTRIM DS) 800-160 MG per tablet   Oral   Take 1 tablet by mouth 2 (two) times daily.          BP 119/43  Pulse 69  Temp(Src) 97.5 F (36.4 C) (Oral)  Resp 18  Ht 5\' 4"  (1.626 m)  Wt 160 lb (72.576 kg)  BMI 27.45 kg/m2  SpO2 98%  Physical Exam  Nursing note and vitals reviewed. Constitutional: She is oriented to person, place, and time. She appears well-developed and well-nourished.  HENT:  Head: Normocephalic and atraumatic.  Eyes: Conjunctivae and EOM are normal. Pupils are equal, round, and reactive to light.  Neck: Normal range of motion and phonation normal. Neck supple.  Cardiovascular: Normal rate, regular rhythm and intact distal pulses.   Pulmonary/Chest: Effort normal and breath  sounds normal. She exhibits no tenderness.  Abdominal: Soft. She exhibits no distension. There is tenderness (Mild bilateral lower quadrant tenderness). There is no guarding.  Genitourinary:  No CVA tenderness  Musculoskeletal: Normal range of motion. She exhibits edema (1+ edema of lower legs).  Neurological: She is alert and oriented to person, place, and time. She has normal strength. She exhibits normal muscle tone.  Skin: Skin is warm and dry.  Psychiatric: She has a normal mood and affect. Her behavior is normal. Judgment and thought content normal.    ED Course  Procedures (including critical care time)  DIAGNOSTIC STUDIES: Oxygen Saturation is 98% on room air, normal by my interpretation.    COORDINATION OF CARE: 3:31 PM-Discussed treatment plan which includes CT abdomen and labs with pt at bedside and pt agreed to plan.    Patient Vitals for the past 24 hrs:  BP Temp Temp src Pulse Resp SpO2 Height Weight  04/01/13 1734 128/80 mmHg 97.7 F (36.5 C) Oral 81 18 96 % - -  04/01/13 1522 119/43 mmHg 97.5 F (36.4 C) Oral 69 18 98 % 5\' 4"  (1.626 m) 160 lb (72.576 kg)     Labs Reviewed  BASIC METABOLIC PANEL - Abnormal; Notable for the following:    Sodium 129 (*)    Chloride 92 (*)    Glucose, Bld 126 (*)    Creatinine, Ser 1.45 (*)    GFR calc non Af Amer 33 (*)    GFR calc Af Amer 38 (*)    All other components within normal limits  URINE CULTURE  URINALYSIS, ROUTINE W REFLEX MICROSCOPIC  CBC WITH DIFFERENTIAL    Ct Abdomen Pelvis Wo Contrast  04/01/2013   *RADIOLOGY REPORT*  Clinical Data: UTI.  Right nephrectomy.  History of renal carcinoma and thyroid cancer.  Flank pain  CT ABDOMEN AND PELVIS WITHOUT CONTRAST  Technique:  Multidetector CT imaging of the abdomen and pelvis was performed following the standard protocol without intravenous contrast.  Comparison: CT 01/26/2011  Findings: Right nephrectomy, unchanged.  No recurrent mass in the right renal bed.  Left  kidney shows no obstruction or mass.  No renal calculi on the left.  There is atherosclerotic calcification in the left renal artery as well as throughout the aorta and iliacs.  Urinary bladder is normal.  Extensive diverticulosis throughout the colon without evidence of diverticulitis.  No abscess or mass.  No free fluid.  No adenopathy.  Multiple gallstones are present.  These are not visualized on the prior study. Small hepatic cysts  are present.  Small hepatic calcifications are unchanged.  Bronchiectasis and scarring in the lung bases.  13 mm nodule posteriorly on the right appears larger compared to the prior study.  This is not fully evaluated on this study.  Consider chest CT for further evaluation.  IMPRESSION: Postop right nephrectomy.  No recurrent tumor.  No evidence of obstruction or stone in the left kidney.  Pandiverticulosis of the colon without evidence of acute abnormality.  Bronchiectasis and scarring in the lung bases.  There is a 13 mm pleural-based nodule posteriorly on the right which is larger compared with  the prior study.  This could represent inflammation or a neoplasm.  Consider chest CT for further evaluation.   Original Report Authenticated By: Janeece Riggers, M.D.    1. Back pain   2. Abdominal pain      MDM  Nonspecific flank pain, possibly related to past kidney stone. Evidence for UTI, metabolic instability, or suspected severe bacterial infection. She is stable for discharge. Her daughter came to pick her up. Her daughter was given the findings   Nursing Notes Reviewed/ Care Coordinated, and agree without changes. Applicable Imaging Reviewed.  Interpretation of Laboratory Data incorporated into ED treatment   Plan: Home Medications- usual; Home Treatments and Observation- rest; return here if the recommended treatment, does not improve the symptoms; Recommended follow up- PCP, when necessary      I personally performed the services described in this  documentation, which was scribed in my presence. The recorded information has been reviewed and is accurate.      Flint Melter, MD 04/01/13 [phone]

## 2013-04-01 NOTE — ED Notes (Signed)
[facility] called for report, report given to [facility]. [facility] arranging transportation.

## 2013-04-01 NOTE — ED Notes (Signed)
Pt's daughter on the way to pick up pt

## 2013-04-03 LAB — URINE CULTURE: Culture: NO GROWTH

## 2013-04-29 ENCOUNTER — Other Ambulatory Visit: Payer: Self-pay

## 2013-06-25 ENCOUNTER — Encounter (HOSPITAL_COMMUNITY): Payer: Self-pay

## 2013-06-25 ENCOUNTER — Emergency Department (HOSPITAL_COMMUNITY)
Admission: EM | Admit: 2013-06-25 | Discharge: 2013-06-25 | Disposition: A | Discharge status: Home / Self Care | Payer: Medicare Other | Attending: Emergency Medicine | Admitting: Emergency Medicine

## 2013-06-25 ENCOUNTER — Emergency Department (HOSPITAL_COMMUNITY): Payer: Medicare Other

## 2013-06-25 DIAGNOSIS — N183 Chronic kidney disease, stage 3 unspecified: Secondary | ICD-10-CM | POA: Insufficient documentation

## 2013-06-25 DIAGNOSIS — W050XXA Fall from non-moving wheelchair, initial encounter: Secondary | ICD-10-CM | POA: Insufficient documentation

## 2013-06-25 DIAGNOSIS — Z859 Personal history of malignant neoplasm, unspecified: Secondary | ICD-10-CM | POA: Insufficient documentation

## 2013-06-25 DIAGNOSIS — Z880 Allergy status to penicillin: Secondary | ICD-10-CM | POA: Insufficient documentation

## 2013-06-25 DIAGNOSIS — Y9389 Activity, other specified: Secondary | ICD-10-CM | POA: Insufficient documentation

## 2013-06-25 DIAGNOSIS — E079 Disorder of thyroid, unspecified: Secondary | ICD-10-CM | POA: Insufficient documentation

## 2013-06-25 DIAGNOSIS — Z862 Personal history of diseases of the blood and blood-forming organs and certain disorders involving the immune mechanism: Secondary | ICD-10-CM | POA: Insufficient documentation

## 2013-06-25 DIAGNOSIS — Y929 Unspecified place or not applicable: Secondary | ICD-10-CM | POA: Insufficient documentation

## 2013-06-25 DIAGNOSIS — Z043 Encounter for examination and observation following other accident: Secondary | ICD-10-CM | POA: Insufficient documentation

## 2013-06-25 DIAGNOSIS — Z79899 Other long term (current) drug therapy: Secondary | ICD-10-CM | POA: Insufficient documentation

## 2013-06-25 DIAGNOSIS — I129 Hypertensive chronic kidney disease with stage 1 through stage 4 chronic kidney disease, or unspecified chronic kidney disease: Secondary | ICD-10-CM | POA: Insufficient documentation

## 2013-06-25 DIAGNOSIS — W19XXXA Unspecified fall, initial encounter: Secondary | ICD-10-CM

## 2013-06-25 MED ORDER — NAPROXEN 500 MG PO TABS: 500.0000 mg | ORAL_TABLET | Freq: Two times a day (BID) | ORAL | Status: DC

## 2013-06-25 MED ORDER — NAPROXEN 250 MG PO TABS
500.0000 mg | ORAL_TABLET | Freq: Once | ORAL | Status: AC
  Administered 2013-06-25: 500 mg via ORAL
  Filled 2013-06-25: qty 2

## 2013-06-25 NOTE — ED Notes (Signed)
Facility requested that pt be brought back via EMS. Facility aware that they would be charged for transport. Facility verbalized understanding. Discharge instructions reviewed with pt's nurse at Facility, [place].

## 2013-06-25 NOTE — ED Provider Notes (Signed)
CSN: [id number]     Arrival date & time 06/25/13  1047 History   This chart was scribed for Vida Roller, MD, by Yevette Edwards, ED Scribe. This patient was seen in room APA04/APA04 and the patient's care was started at 10:54 AM.  None    Chief Complaint  Patient presents with  . Fall    The history is provided by the patient and the EMS personnel. No language interpreter was used.   HPI Comments: Carolyn Hughes is a 79 y.o. female who presents to the Emergency Department complaining of fall which occurred PTA when the pt reached for an object and fell out of her wheelchair at [facility]; the fall was supposedly witnessed. When EMS arrived, they immobilized her with a BB and CC; EMS believed she may have  left leg deformity. The pt does not recall the fall, though she was orientated to person, place, and time in the ED. She denies any pain to her neck, hips bilaterally, or extremities. She stated she has mild suprapubic pain, but she then she reported that she was not experiencing any pain. She has a h/o syncope and HTN.   Past Medical History  Diagnosis Date  . Hypertension   . Anemia 08/05/2012  . Hypertrophic obstructive cardiomyopathy(425.11) 09/09/2006    Qualifier: Diagnosis of  By: Jen Mow MD, Christine    . Syncope and collapse 08/01/2012    Secondary to dehydration  . Cancer   . CKD (chronic kidney disease) stage 3, GFR 30-59 ml/min 08/01/2012  . Thyroid disease    Past Surgical History  Procedure Laterality Date  . Abdominal hysterectomy    . Kidney surgery    . Surgery for thyroid cancer    . Nephrectomy Right     cancer   No family history on file. History  Substance Use Topics  . Smoking status: Never Smoker   . Smokeless tobacco: Not on file  . Alcohol Use: No   No OB history provided.  Review of Systems  HENT: Negative for neck pain.   Musculoskeletal: Negative for myalgias, back pain and arthralgias.  All other systems reviewed and are  negative.    Allergies  Penicillins; Tape; and Iohexol  Home Medications   Current Outpatient Rx  Name  Route  Sig  Dispense  Refill  . acetaminophen (TYLENOL) 500 MG tablet   Oral   Take 500 mg by mouth every 6 (six) hours as needed for pain.         Marland Kitchen irbesartan-hydrochlorothiazide (AVALIDE) 150-12.5 MG per tablet   Oral   Take 1 tablet by mouth daily.         Marland Kitchen levothyroxine (SYNTHROID, LEVOTHROID) 88 MCG tablet   Oral   Take 88 mcg by mouth every morning.         . naproxen (NAPROSYN) 500 MG tablet   Oral   Take 1 tablet (500 mg total) by mouth 2 (two) times daily with a meal.   30 tablet   0   . omeprazole (PRILOSEC) 20 MG capsule   Oral   Take 20 mg by mouth daily.         Marland Kitchen sulfamethoxazole-trimethoprim (BACTRIM DS) 800-160 MG per tablet   Oral   Take 1 tablet by mouth 2 (two) times daily.          Triage Vitals: BP 134/75  Pulse 80  Resp 20  Ht 5\' 2"  (1.575 m)  Wt 160 lb (72.576 kg)  BMI  29.26 kg/m2  SpO2 95%  Physical Exam  Nursing note and vitals reviewed. Constitutional: She is oriented to person, place, and time. She appears well-developed and well-nourished. No distress.  HENT:  Head: Normocephalic and atraumatic.  No signs of hematoma.     Eyes: EOM are normal.  Neck: Neck supple. No tracheal deviation present.  No tenderness over cervical spine.   Cardiovascular: Normal rate, regular rhythm and normal heart sounds.   No murmur heard. Pulmonary/Chest: Effort normal and breath sounds normal. No respiratory distress. She has no wheezes.  Abdominal: Soft. There is no tenderness.  Musculoskeletal: Normal range of motion.  Neurological: She is alert and oriented to person, place, and time.  Follows commands and moves all extremities with  normal strength.   Skin: Skin is warm and dry.  Psychiatric: She has a normal mood and affect. Her behavior is normal.    ED Course  Procedures (including critical care time)  DIAGNOSTIC  STUDIES: Oxygen Saturation is 95% on room air, adequate by my interpretation.    COORDINATION OF CARE:  10:54 AM- Assessed pt and then removed C-collar and board.    10:56 AM- Discussed treatment plan with patient, and the patient agreed to the plan.   Labs Review Labs Reviewed - No data to display Imaging Review Dg Hip Complete Left  06/25/2013   CLINICAL DATA:  Pain post trauma  EXAM: LEFT HIP - COMPLETE 2+ VIEW  COMPARISON:  None.  FINDINGS: Frontal pelvis as well as frontal and lateral left hip images were obtained. There is slight symmetric narrowing of both hip joints. No fracture or dislocation. No erosive change.  IMPRESSION: Mild symmetric narrowing of both hip joints. No fracture or dislocation   Electronically Signed   By: Bretta Bang   On: 06/25/2013 12:11    MDM   1. Fall, initial encounter     xrays neg for fractures - pt is stable without signs of trauma - reportedly fell from wheelchair - now stable for d/c back to facility.  I personally performed the services described in this documentation, which was scribed in my presence. The recorded information has been reviewed and is accurate.        Vida Roller, MD 06/26/13 [phone]

## 2013-06-25 NOTE — ED Notes (Signed)
Per EMS pt here from [facility] for eval of hematoma to head. States pt was sitting in her wheelchair and was reaching for an object on the floor and tumbled out

## 2013-07-30 ENCOUNTER — Other Ambulatory Visit: Payer: Self-pay

## 2013-11-01 ENCOUNTER — Emergency Department (HOSPITAL_COMMUNITY): Payer: Medicare Other

## 2013-11-01 ENCOUNTER — Observation Stay (HOSPITAL_COMMUNITY)
Admission: EM | Admit: 2013-11-01 | Discharge: 2013-11-03 | Disposition: A | Discharge status: Skilled Nursing Facility | Payer: Medicare Other | Attending: Family Medicine | Admitting: Family Medicine

## 2013-11-01 ENCOUNTER — Encounter (HOSPITAL_COMMUNITY): Payer: Self-pay | Admitting: Emergency Medicine

## 2013-11-01 DIAGNOSIS — N183 Chronic kidney disease, stage 3 unspecified: Secondary | ICD-10-CM | POA: Diagnosis not present

## 2013-11-01 DIAGNOSIS — Z85850 Personal history of malignant neoplasm of thyroid: Secondary | ICD-10-CM | POA: Insufficient documentation

## 2013-11-01 DIAGNOSIS — Z8542 Personal history of malignant neoplasm of other parts of uterus: Secondary | ICD-10-CM | POA: Insufficient documentation

## 2013-11-01 DIAGNOSIS — I129 Hypertensive chronic kidney disease with stage 1 through stage 4 chronic kidney disease, or unspecified chronic kidney disease: Secondary | ICD-10-CM | POA: Diagnosis not present

## 2013-11-01 DIAGNOSIS — M6282 Rhabdomyolysis: Secondary | ICD-10-CM

## 2013-11-01 DIAGNOSIS — I421 Obstructive hypertrophic cardiomyopathy: Secondary | ICD-10-CM | POA: Insufficient documentation

## 2013-11-01 DIAGNOSIS — E860 Dehydration: Secondary | ICD-10-CM

## 2013-11-01 DIAGNOSIS — D649 Anemia, unspecified: Secondary | ICD-10-CM | POA: Insufficient documentation

## 2013-11-01 DIAGNOSIS — F0390 Unspecified dementia without behavioral disturbance: Secondary | ICD-10-CM | POA: Diagnosis not present

## 2013-11-01 DIAGNOSIS — R55 Syncope and collapse: Secondary | ICD-10-CM

## 2013-11-01 DIAGNOSIS — R0602 Shortness of breath: Secondary | ICD-10-CM | POA: Diagnosis present

## 2013-11-01 DIAGNOSIS — R9431 Abnormal electrocardiogram [ECG] [EKG]: Secondary | ICD-10-CM

## 2013-11-01 DIAGNOSIS — Z85528 Personal history of other malignant neoplasm of kidney: Secondary | ICD-10-CM | POA: Diagnosis not present

## 2013-11-01 DIAGNOSIS — E039 Hypothyroidism, unspecified: Secondary | ICD-10-CM | POA: Insufficient documentation

## 2013-11-01 DIAGNOSIS — J189 Pneumonia, unspecified organism: Secondary | ICD-10-CM | POA: Diagnosis not present

## 2013-11-01 DIAGNOSIS — R05 Cough: Secondary | ICD-10-CM

## 2013-11-01 DIAGNOSIS — J984 Other disorders of lung: Secondary | ICD-10-CM

## 2013-11-01 DIAGNOSIS — C569 Malignant neoplasm of unspecified ovary: Secondary | ICD-10-CM

## 2013-11-01 DIAGNOSIS — E785 Hyperlipidemia, unspecified: Secondary | ICD-10-CM

## 2013-11-01 DIAGNOSIS — R001 Bradycardia, unspecified: Secondary | ICD-10-CM

## 2013-11-01 DIAGNOSIS — R059 Cough, unspecified: Secondary | ICD-10-CM

## 2013-11-01 DIAGNOSIS — I10 Essential (primary) hypertension: Secondary | ICD-10-CM

## 2013-11-01 DIAGNOSIS — C649 Malignant neoplasm of unspecified kidney, except renal pelvis: Secondary | ICD-10-CM

## 2013-11-01 LAB — COMPREHENSIVE METABOLIC PANEL
ALBUMIN: 2.7 g/dL — AB (ref 3.5–5.2)
ALT: 8 U/L (ref 0–35)
AST: 15 U/L (ref 0–37)
Alkaline Phosphatase: 44 U/L (ref 39–117)
BUN: 47 mg/dL — AB (ref 6–23)
CALCIUM: 8.5 mg/dL (ref 8.4–10.5)
CO2: 26 mEq/L (ref 19–32)
Chloride: 93 mEq/L — ABNORMAL LOW (ref 96–112)
Creatinine, Ser: 1.7 mg/dL — ABNORMAL HIGH (ref 0.50–1.10)
GFR calc non Af Amer: 27 mL/min — ABNORMAL LOW (ref >90)
GFR, EST AFRICAN AMERICAN: 31 mL/min — AB (ref >90)
GLUCOSE: 96 mg/dL (ref 70–99)
POTASSIUM: 3.7 meq/L (ref 3.7–5.3)
SODIUM: 134 meq/L — AB (ref 137–147)
TOTAL PROTEIN: 6.2 g/dL (ref 6.0–8.3)
Total Bilirubin: 0.3 mg/dL (ref 0.3–1.2)

## 2013-11-01 LAB — URINALYSIS, ROUTINE W REFLEX MICROSCOPIC
Bilirubin Urine: NEGATIVE
Glucose, UA: NEGATIVE mg/dL
Hgb urine dipstick: NEGATIVE
Ketones, ur: NEGATIVE mg/dL
LEUKOCYTES UA: NEGATIVE
NITRITE: NEGATIVE
PH: 5.5 (ref 5.0–8.0)
Protein, ur: NEGATIVE mg/dL
UROBILINOGEN UA: 0.2 mg/dL (ref 0.0–1.0)

## 2013-11-01 LAB — CBC WITH DIFFERENTIAL/PLATELET
BASOS PCT: 0 % (ref 0–1)
Basophils Absolute: 0 10*3/uL (ref 0.0–0.1)
EOS ABS: 0.1 10*3/uL (ref 0.0–0.7)
EOS PCT: 1 % (ref 0–5)
HCT: 38.8 % (ref 36.0–46.0)
Hemoglobin: 12.9 g/dL (ref 12.0–15.0)
LYMPHS ABS: 1.6 10*3/uL (ref 0.7–4.0)
Lymphocytes Relative: 20 % (ref 12–46)
MCH: 30.3 pg (ref 26.0–34.0)
MCHC: 33.2 g/dL (ref 30.0–36.0)
MCV: 91.1 fL (ref 78.0–100.0)
Monocytes Absolute: 1.1 10*3/uL — ABNORMAL HIGH (ref 0.1–1.0)
Monocytes Relative: 14 % — ABNORMAL HIGH (ref 3–12)
NEUTROS PCT: 65 % (ref 43–77)
Neutro Abs: 5.4 10*3/uL (ref 1.7–7.7)
PLATELETS: 337 10*3/uL (ref 150–400)
RBC: 4.26 MIL/uL (ref 3.87–5.11)
RDW: 13.4 % (ref 11.5–15.5)
WBC: 8.3 10*3/uL (ref 4.0–10.5)

## 2013-11-01 LAB — LIPASE, BLOOD: LIPASE: 23 U/L (ref 11–59)

## 2013-11-01 LAB — PRO B NATRIURETIC PEPTIDE: PRO B NATRI PEPTIDE: 2993 pg/mL — AB (ref 0–450)

## 2013-11-01 LAB — VALPROIC ACID LEVEL: VALPROIC ACID LVL: 43.1 ug/mL — AB (ref 50.0–100.0)

## 2013-11-01 MED ORDER — SODIUM CHLORIDE 0.9 % IV BOLUS (SEPSIS)
1000.0000 mL | Freq: Once | INTRAVENOUS | Status: AC
  Administered 2013-11-01: 1000 mL via INTRAVENOUS

## 2013-11-01 NOTE — ED Notes (Signed)
Patient from [facility] brought to ED for c/o difficulty breathing.  Patient states that she feels like she cannot get a full breath.  Patient states that her breathing feels improved

## 2013-11-01 NOTE — ED Provider Notes (Signed)
CSN: [id number]     Arrival date & time 11/01/13  2155 History  This chart was scribed for Ezequiel Essex, MD by Zettie Pho, ED Scribe. This patient was seen in room APA15/APA15 and the patient's care was started at 10:18 PM.    Chief Complaint  Patient presents with  . Shortness of Breath   The history is provided by the patient. No language interpreter was used.   HPI Comments: EMRI SAMPLE is a 80 y.o. Female with a history of HTN and hypertrophic obstructive cardiomyopathy who presents to the Emergency Department complaining of shortness of breath with associated dizziness onset earlier today that she states has since resolved. She states that she also had some shortness of breath about 3 weeks ago, which also resolved on its own. Patient states that she has had normal PO intake. She denies chest pain, abdominal pain, cough, fever, back pain, emesis, diarrhea. Patient also has a history of anemia, cancer, thyroid disease, and stage 3 chronic kidney disease.  Past Medical History  Diagnosis Date  . Hypertension   . Anemia 08/05/2012  . Hypertrophic obstructive cardiomyopathy(425.11) 09/09/2006    Qualifier: Diagnosis of  By: Truett Mainland MD, Christine    . Syncope and collapse 08/01/2012    Secondary to dehydration  . Cancer   . CKD (chronic kidney disease) stage 3, GFR 30-59 ml/min 08/01/2012  . Thyroid disease    Past Surgical History  Procedure Laterality Date  . Abdominal hysterectomy    . Kidney surgery    . Surgery for thyroid cancer    . Nephrectomy Right     cancer   No family history on file. History  Substance Use Topics  . Smoking status: Never Smoker   . Smokeless tobacco: Not on file  . Alcohol Use: No   OB History   Grav Para Term Preterm Abortions TAB SAB Ect Mult Living                 Review of Systems  Unable to perform ROS: Dementia    Level 5 caveat for dementia.    Allergies  Penicillins; Tape; and Iohexol  Home Medications   Current Outpatient  Rx  Name  Route  Sig  Dispense  Refill  . Calcium Carb-Cholecalciferol (CALCIUM 600 + D PO)   Oral   Take 1 tablet by mouth daily.         . divalproex (DEPAKOTE) 125 MG DR tablet   Oral   Take 125 mg by mouth 3 (three) times daily.         Marland Kitchen guaifenesin (ROBITUSSIN) 100 MG/5ML syrup   Oral   Take 200 mg by mouth every 6 (six) hours as needed for cough. Not to exceed 4 doses         . irbesartan-hydrochlorothiazide (AVALIDE) 150-12.5 MG per tablet   Oral   Take 1 tablet by mouth daily.         Marland Kitchen levofloxacin (LEVAQUIN) 750 MG tablet   Oral   Take 750 mg by mouth daily. For 5 days(start 10/29/13)         . levothyroxine (SYNTHROID, LEVOTHROID) 88 MCG tablet   Oral   Take 88 mcg by mouth every morning.         . loperamide (IMODIUM A-D) 2 MG tablet   Oral   Take 2 mg by mouth as needed for diarrhea or loose stools. Not to exceed 8 doses in 24hrs.         Marland Kitchen  LORazepam (ATIVAN) 0.5 MG tablet   Oral   Take 0.25 mg by mouth every 4 (four) hours as needed for anxiety.         . Melatonin 1 MG TABS   Oral   Take 1 mg by mouth at bedtime.         Marland Kitchen omeprazole (PRILOSEC) 20 MG capsule   Oral   Take 20 mg by mouth daily.         Marland Kitchen acetaminophen (TYLENOL) 500 MG tablet   Oral   Take 500 mg by mouth every 6 (six) hours as needed for pain.          Triage Vitals: BP 83/50  Pulse 102  Temp(Src) 97.8 F (36.6 C) (Oral)  Resp 20  Ht 5\' 4"  (1.626 m)  Wt 131 lb (59.421 kg)  BMI 22.47 kg/m2  SpO2 94%  Physical Exam  Nursing note and vitals reviewed. Constitutional: She is oriented to person, place, and time. She appears well-developed and well-nourished. No distress.  HENT:  Head: Normocephalic and atraumatic.  Mouth/Throat: Oropharynx is clear and moist. No oropharyngeal exudate.  Dry mucus membranes. Oropharynx clear.   Eyes: EOM are normal. Pupils are equal, round, and reactive to light. Right conjunctiva is injected.  Injected conjunctiva on the  right.   Neck: Normal range of motion. Neck supple.  Cardiovascular: Normal rate, regular rhythm and normal heart sounds.   No murmur heard. Pulmonary/Chest: Effort normal and breath sounds normal. No respiratory distress.  Abdominal: Soft. Bowel sounds are normal. She exhibits no distension. There is no tenderness.  Musculoskeletal: Normal range of motion. She exhibits no edema and no tenderness.  Neurological: She is alert and oriented to person, place, and time. No cranial nerve deficit. She exhibits normal muscle tone. Coordination normal.  Skin: Skin is warm and dry.  Psychiatric: She has a normal mood and affect. Her behavior is normal.    ED Course  Procedures (including critical care time)  DIAGNOSTIC STUDIES: Oxygen Saturation is 94% on room air, adequate by my interpretation.    COORDINATION OF CARE: 10:24 PM- Ordered blood labs, UA, a chest x-ray, and EKG. Will order IV fluids to manage symptoms. Discussed treatment plan with patient at bedside and patient verbalized agreement.     Labs Review Labs Reviewed  CBC WITH DIFFERENTIAL - Abnormal; Notable for the following:    Monocytes Relative 14 (*)    Monocytes Absolute 1.1 (*)    All other components within normal limits  COMPREHENSIVE METABOLIC PANEL - Abnormal; Notable for the following:    Sodium 134 (*)    Chloride 93 (*)    BUN 47 (*)    Creatinine, Ser 1.70 (*)    Albumin 2.7 (*)    GFR calc non Af Amer 27 (*)    GFR calc Af Amer 31 (*)    All other components within normal limits  URINALYSIS, ROUTINE W REFLEX MICROSCOPIC - Abnormal; Notable for the following:    Specific Gravity, Urine >1.030 (*)    All other components within normal limits  VALPROIC ACID LEVEL - Abnormal; Notable for the following:    Valproic Acid Lvl 43.1 (*)    All other components within normal limits  PRO B NATRIURETIC PEPTIDE - Abnormal; Notable for the following:    Pro B Natriuretic peptide (BNP) 2993.0 (*)    All other  components within normal limits  LIPASE, BLOOD  LACTIC ACID, PLASMA   Imaging Review Dg Chest 2 View  11/01/2013   CLINICAL DATA:  Cough.  Congestion.  Weakness.  Fever.  EXAM: CHEST  2 VIEW  COMPARISON:  DG CHEST 2 VIEW dated 10/06/2012; DG CHEST 2 VIEW dated 08/20/2012; DG CHEST 1V PORT dated 08/18/2012; CT CHEST W/O CM dated 08/18/2012  FINDINGS: New opacity is present projected over the cardiopericardial silhouette on the lateral view and on the frontal view, this localizes the inferior right upper lobe. Findings are compatible with pneumonia. This is superimposed on chronic changes of the chest. Lower lung volumes are present.  Followup to ensure radiographic clearing and exclude an underlying lesion is recommended. Typically clearing will be observed at 8 weeks.  Thoracic kyphosis is present. Surgical clips are present in the upper abdomen and in the thoracic inlet. Pulmonary nodule seen on prior chest CT (08/18/2012) are present however difficult to visualize on the background of chronic lung disease.  IMPRESSION: New right upper lobe airspace disease most consistent with pneumonia. Background chronic interstitial and airspace opacity is likely associated with atypical infection/ mycobacterium avium complex.   Electronically Signed   By: Dereck Ligas M.D.   On: 11/01/2013 23:47    EKG Interpretation    Date/Time:  Sunday November 01 2013 22:30:26 EST Ventricular Rate:  116 PR Interval:  140 QRS Duration: 114 QT Interval:  394 QTC Calculation: 547 R Axis:   -90 Text Interpretation:  Sinus tachycardia with frequent Premature ventricular complexes Possible Left atrial enlargement Incomplete right bundle branch block Right ventricular hypertrophy with repolarization abnormality Inferior infarct (cited on or before 01-Nov-2013) Anterolateral infarct (cited on or before 01-Nov-2013) Prolonged QT Abnormal ECG Rate faster Confirmed by Kline Bulthuis  MD, Elma Shands ([id number]) on 11/01/2013 11:08:14 PM             MDM   1. CAP (community acquired pneumonia)   2. Prolonged Q-T interval on ECG    EMS called for difficulty breathing. On arrival patient denies any complaints. She denies any chest pain, abdominal pain or shortness of breath. She feels that she is breathing well.   She has borderline blood pressures but denies any dizziness or lightheadedness. Patient appears nontoxic. Chest x-ray shows infiltrate in the right side. Patient with allergy to penicillin as well as prolonged QT interval on today's EKG. Previous EKGs do not show prolonged QT. Notably she has been on Levaquin for the past 4 days at her nursing home.  We'll discontinue fluoroquinolones and macrolides. We'll treat with doxycycline given penicillin allergy. Blood pressure has improved to [id number] systolic after 1 L of fluid. She denies any complaints. She does not appear toxic or septic. She'll be admitted for IV antibiotics and monitoring of her QT interval.   I personally performed the services described in this documentation, which was scribed in my presence. The recorded information has been reviewed and is accurate.     Ezequiel Essex, MD 11/02/13 0030

## 2013-11-02 DIAGNOSIS — R9431 Abnormal electrocardiogram [ECG] [EKG]: Secondary | ICD-10-CM | POA: Diagnosis present

## 2013-11-02 DIAGNOSIS — N183 Chronic kidney disease, stage 3 unspecified: Secondary | ICD-10-CM

## 2013-11-02 DIAGNOSIS — J189 Pneumonia, unspecified organism: Secondary | ICD-10-CM | POA: Diagnosis not present

## 2013-11-02 DIAGNOSIS — R059 Cough, unspecified: Secondary | ICD-10-CM | POA: Diagnosis present

## 2013-11-02 DIAGNOSIS — I421 Obstructive hypertrophic cardiomyopathy: Secondary | ICD-10-CM

## 2013-11-02 DIAGNOSIS — R05 Cough: Secondary | ICD-10-CM | POA: Diagnosis present

## 2013-11-02 LAB — CBC
HEMATOCRIT: 33.7 % — AB (ref 36.0–46.0)
Hemoglobin: 11.2 g/dL — ABNORMAL LOW (ref 12.0–15.0)
MCH: 30.4 pg (ref 26.0–34.0)
MCHC: 33.2 g/dL (ref 30.0–36.0)
MCV: 91.6 fL (ref 78.0–100.0)
Platelets: 257 10*3/uL (ref 150–400)
RBC: 3.68 MIL/uL — AB (ref 3.87–5.11)
RDW: 13.3 % (ref 11.5–15.5)
WBC: 6.3 10*3/uL (ref 4.0–10.5)

## 2013-11-02 LAB — MRSA PCR SCREENING: MRSA BY PCR: NEGATIVE

## 2013-11-02 LAB — CREATININE, SERUM
Creatinine, Ser: 1.4 mg/dL — ABNORMAL HIGH (ref 0.50–1.10)
GFR calc non Af Amer: 34 mL/min — ABNORMAL LOW (ref >90)
GFR, EST AFRICAN AMERICAN: 39 mL/min — AB (ref >90)

## 2013-11-02 LAB — LACTIC ACID, PLASMA: Lactic Acid, Venous: 1 mmol/L (ref 0.5–2.2)

## 2013-11-02 MED ORDER — DIVALPROEX SODIUM 125 MG PO DR TAB
125.0000 mg | DELAYED_RELEASE_TABLET | Freq: Three times a day (TID) | ORAL | Status: DC
  Administered 2013-11-02 – 2013-11-03 (×5): 125 mg via ORAL
  Filled 2013-11-02 (×9): qty 1

## 2013-11-02 MED ORDER — LEVOTHYROXINE SODIUM 88 MCG PO TABS
88.0000 ug | ORAL_TABLET | Freq: Every day | ORAL | Status: DC
  Administered 2013-11-02 – 2013-11-03 (×2): 88 ug via ORAL
  Filled 2013-11-02 (×4): qty 1

## 2013-11-02 MED ORDER — LORAZEPAM 0.5 MG PO TABS: 0.2500 mg | ORAL_TABLET | ORAL | Status: DC | PRN

## 2013-11-02 MED ORDER — DOXYCYCLINE HYCLATE 100 MG IV SOLR
INTRAVENOUS | Status: AC
Start: 2013-11-02 — End: 2013-11-02
  Filled 2013-11-02: qty 100

## 2013-11-02 MED ORDER — DOXYCYCLINE HYCLATE 100 MG PO TABS
100.0000 mg | ORAL_TABLET | Freq: Two times a day (BID) | ORAL | Status: DC
  Administered 2013-11-02 – 2013-11-03 (×3): 100 mg via ORAL
  Filled 2013-11-02 (×3): qty 1

## 2013-11-02 MED ORDER — DEXTROSE 5 % IV SOLN
100.0000 mg | Freq: Two times a day (BID) | INTRAVENOUS | Status: DC
  Administered 2013-11-02: 100 mg via INTRAVENOUS
  Filled 2013-11-02 (×4): qty 100

## 2013-11-02 MED ORDER — ONDANSETRON HCL 4 MG PO TABS
4.0000 mg | ORAL_TABLET | Freq: Three times a day (TID) | ORAL | Status: DC | PRN
  Administered 2013-11-02: 4 mg via ORAL
  Filled 2013-11-02: qty 1

## 2013-11-02 MED ORDER — ENOXAPARIN SODIUM 30 MG/0.3ML ~~LOC~~ SOLN
30.0000 mg | SUBCUTANEOUS | Status: DC
  Administered 2013-11-02 – 2013-11-03 (×2): 30 mg via SUBCUTANEOUS
  Filled 2013-11-02 (×2): qty 0.3

## 2013-11-02 MED ORDER — SODIUM CHLORIDE 0.9 % IV BOLUS (SEPSIS)
1000.0000 mL | Freq: Once | INTRAVENOUS | Status: AC
  Administered 2013-11-02: 1000 mL via INTRAVENOUS

## 2013-11-02 MED ORDER — SODIUM CHLORIDE 0.9 % IV SOLN: INTRAVENOUS | Status: AC

## 2013-11-02 MED ORDER — GUAIFENESIN 100 MG/5ML PO SOLN
200.0000 mg | Freq: Four times a day (QID) | ORAL | Status: DC | PRN
  Filled 2013-11-02: qty 10

## 2013-11-02 NOTE — Clinical Social Work Psychosocial (Signed)
Clinical Social Work Department BRIEF PSYCHOSOCIAL ASSESSMENT 11/02/2013  Patient:  Carolyn Hughes, Carolyn Hughes     Account Number:  [id number]     Admit date:  11/01/2013  Clinical Social Worker:  Wyatt Haste  Date/Time:  11/02/2013 12:32 PM  Referred by:  CSW  Date Referred:  11/02/2013 Referred for  ALF Placement   Other Referral:   Interview type:  Family Other interview type:   step-daughter Levada Dy    PSYCHOSOCIAL DATA Living Status:  FACILITY Admitted from facility:  [facility] Level of care:  Assisted Living Primary support name:  Levada Dy Primary support relationship to patient:  CHILD, ADULT Degree of support available:   supportive    CURRENT CONCERNS Current Concerns  Post-Acute Placement   Other Concerns:    SOCIAL WORK ASSESSMENT / PLAN CSW spoke with pt's step-daughter Levada Dy as pt is oriented to self only. Levada Dy reports pt has no biological children. Pt married Angela's father in the1980s and he passed away in 01/17/2000. She has only one living sibling out of 60. Levada Dy reports she is HCPOA. She is able to visit pt on weekends. Pt has been resident at [facility] since last January. She originally was on AL unit and then moved to dementia unit in the fall. At baseline, pt can sometimes feed herself, but other than that requires extensive assist with ADLs. Staff generally put pt in wheelchair. Avis at facility confirms above and reports pt did not have home health prior to d/c. She was being treated for pneumonia beginning on 2/5 per Avis. Okay for return. Levada Dy indicates pt is now on Medicaid. Notified Levada Dy and Avis that this is not on file for pt here and requested them to call registration.   Assessment/plan status:  Psychosocial Support/Ongoing Assessment of Needs Other assessment/ plan:   Information/referral to community resources:   [facility]    PATIENT'S/FAMILY'S RESPONSE TO PLAN OF CARE: Pt unable to discuss plan of care due to  dementia. Pt's step-daughter/POA requests return to [facility] when medically stable and facility is agreeable. CSW will continue to follow.       Benay Pike, [place]

## 2013-11-02 NOTE — Progress Notes (Signed)
[facility] PROGRESS NOTE  Carolyn Hughes PZW:[id number] DOB: March 31, 1929 DOA: 11/01/2013 PCP: [name], MD  Assessment/Plan:  PNA (pneumonia)-  Has been on levaquin 2/5-2/9. Changed to doxy on admission due to prolonged QT. She remains afebrile, no leukocytosis, not hypoxic. Awaiting legionella antigen and strep pneumoniae antigen.   Active Problems:   Prolonged QT interval  Likely related to previous levaquin use. Levaquin discontinued. Repeat ekg this am without prolonged QT. No CP or sob.   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 Creatinine trending down with IV fluids. Urine output stable.   HTN: somewhat soft. Home medications of HCTZ and ARB being held for now. Will monitor.   Hypothyroidism: will check TSH continue home synthroid  Anemia: mild. Appears at baseline. No s/sx of bleeding will monitor.    Cough: continue robitussin   Code Status: full Family Communication: none present Disposition Plan: from ALF.will request PT for eval anticipate discharge 24 hours   Consultants:  none  Procedures:  none  Antibiotics:  Doxycycline 11/01/13>>>  HPI/Subjective: Alert oriented x2. Denies pain/discomfort  Objective: Filed Vitals:   11/02/13 0500  BP: 104/61  Pulse: 77  Temp: 98.2 F (36.8 C)  Resp: 18    Intake/Output Summary (Last 24 hours) at 11/02/13 4235 Last data filed at 11/02/13 0900  Gross per 24 hour  Intake    120 ml  Output      0 ml  Net    120 ml   Filed Weights   11/01/13 2158 11/02/13 0143  Weight: 59.421 kg (131 lb) 53.4 kg (117 lb 11.6 oz)    Exam:   General:  Well nourished NAD  Cardiovascular: RRR No m/g/r. No LE edema PPP  Respiratory: normal effort BS distant but clear bilaterally no wheeze or rhonchi  Abdomen: flat soft +BS x4. Non-distended and non-tender to palpation  Musculoskeletal: no clubbing or cyanosis.    Data Reviewed: Basic  Metabolic Panel:  Recent Labs Lab 11/01/13 2308 11/02/13 0535  NA 134*  --   K 3.7  --   CL 93*  --   CO2 26  --   GLUCOSE 96  --   BUN 47*  --   CREATININE 1.70* 1.40*  CALCIUM 8.5  --    Liver Function Tests:  Recent Labs Lab 11/01/13 2308  AST 15  ALT 8  ALKPHOS 44  BILITOT 0.3  PROT 6.2  ALBUMIN 2.7*    Recent Labs Lab 11/01/13 2308  LIPASE 23   No results found for this basename: AMMONIA,  in the last 168 hours CBC:  Recent Labs Lab 11/01/13 2308 11/02/13 0535  WBC 8.3 6.3  NEUTROABS 5.4  --   HGB 12.9 11.2*  HCT 38.8 33.7*  MCV 91.1 91.6  PLT 337 257   Cardiac Enzymes: No results found for this basename: CKTOTAL, CKMB, CKMBINDEX, TROPONINI,  in the last 168 hours BNP (last 3 results)  Recent Labs  11/01/13 2308  PROBNP 2993.0*   CBG: No results found for this basename: GLUCAP,  in the last 168 hours  No results found for this or any previous visit (from the past 240 hour(s)).   Studies: Dg Chest 2 View  11/01/2013   CLINICAL DATA:  Cough.  Congestion.  Weakness.  Fever.  EXAM: CHEST  2 VIEW  COMPARISON:  DG CHEST 2 VIEW dated 10/06/2012; DG CHEST 2 VIEW dated 08/20/2012; DG CHEST 1V PORT dated 08/18/2012; CT CHEST W/O  CM dated 08/18/2012  FINDINGS: New opacity is present projected over the cardiopericardial silhouette on the lateral view and on the frontal view, this localizes the inferior right upper lobe. Findings are compatible with pneumonia. This is superimposed on chronic changes of the chest. Lower lung volumes are present.  Followup to ensure radiographic clearing and exclude an underlying lesion is recommended. Typically clearing will be observed at 8 weeks.  Thoracic kyphosis is present. Surgical clips are present in the upper abdomen and in the thoracic inlet. Pulmonary nodule seen on prior chest CT (08/18/2012) are present however difficult to visualize on the background of chronic lung disease.  IMPRESSION: New right upper lobe airspace  disease most consistent with pneumonia. Background chronic interstitial and airspace opacity is likely associated with atypical infection/ mycobacterium avium complex.   Electronically Signed   By: Dereck Ligas M.D.   On: 11/01/2013 23:47    Scheduled Meds: . sodium chloride   Intravenous STAT  . divalproex  125 mg Oral TID  . doxycycline  100 mg Oral Q12H  . enoxaparin (LOVENOX) injection  30 mg Subcutaneous Q24H  . levothyroxine  88 mcg Oral QAC breakfast   Continuous Infusions:   Principal Problem:   PNA (pneumonia) Active Problems:   HYPERTENSION   Hypertrophic obstructive cardiomyopathy(425.11)   CKD (chronic kidney disease) stage 3, GFR 30-59 ml/min   Anemia   Prolonged QT interval   Cough   CAP (community acquired pneumonia)    Time spent: 30 minutes    [place] [facility] Pager [phone]. If 7PM-7AM, please contact night-coverage at [email or web address], password [facility] 11/02/2013, 9:22 AM  LOS: 1 day

## 2013-11-02 NOTE — Progress Notes (Signed)
PT Cancellation Note  Patient Details Name: Carolyn Hughes MRN: [id number] DOB: [birth year]   Cancelled Treatment:    Reason Eval/Treat Not Completed: PT screened, no needs identified, will sign off Pt is a resident of a memory care unit at [facility].  Per their staff, pt is non ambulatory and needs 2 people to transfer her to a w/c.  She is not an appropriate PT candidate.  Demetrios Isaacs L 11/02/2013, 12:50 PM

## 2013-11-02 NOTE — H&P (Signed)
PCP:   [name], MD   Chief Complaint:  sob  HPI: 80 yo female with dementia lives in assisted living on levaquin for 3 days for pna was complaining of sob so sent here for further evaluation.  By time of arrival to ED, she had no complaints and was normal.  No sob, no cp.  She does endorse that she has been coughing.  She appears comfortable, and can provide little useful information.  Her 12 ekg showed significant prolonged qtc which is new, and cxr shows small infiltrate.  Pt has multiple allergies listed to abx.  Review of Systems:  Unobtainable from pt  Past Medical History: Past Medical History  Diagnosis Date  . Hypertension   . Anemia 08/05/2012  . Hypertrophic obstructive cardiomyopathy(425.11) 09/09/2006    Qualifier: Diagnosis of  By: Truett Mainland MD, Christine    . Syncope and collapse 08/01/2012    Secondary to dehydration  . Cancer   . CKD (chronic kidney disease) stage 3, GFR 30-59 ml/min 08/01/2012  . Thyroid disease    Past Surgical History  Procedure Laterality Date  . Abdominal hysterectomy    . Kidney surgery    . Surgery for thyroid cancer    . Nephrectomy Right     cancer    Medications: Prior to Admission medications   Medication Sig Start Date End Date Taking? Authorizing Provider  Calcium Carb-Cholecalciferol (CALCIUM 600 + D PO) Take 1 tablet by mouth daily.   Yes Historical Provider, MD  divalproex (DEPAKOTE) 125 MG DR tablet Take 125 mg by mouth 3 (three) times daily.   Yes Historical Provider, MD  guaifenesin (ROBITUSSIN) 100 MG/5ML syrup Take 200 mg by mouth every 6 (six) hours as needed for cough. Not to exceed 4 doses   Yes Historical Provider, MD  irbesartan-hydrochlorothiazide (AVALIDE) 150-12.5 MG per tablet Take 1 tablet by mouth daily.   Yes Historical Provider, MD  levofloxacin (LEVAQUIN) 750 MG tablet Take 750 mg by mouth daily. For 5 days(start 10/29/13)   Yes Historical Provider, MD  levothyroxine (SYNTHROID, LEVOTHROID) 88 MCG tablet Take 88  mcg by mouth every morning.   Yes Historical Provider, MD  loperamide (IMODIUM A-D) 2 MG tablet Take 2 mg by mouth as needed for diarrhea or loose stools. Not to exceed 8 doses in 24hrs.   Yes Historical Provider, MD  LORazepam (ATIVAN) 0.5 MG tablet Take 0.25 mg by mouth every 4 (four) hours as needed for anxiety.   Yes Historical Provider, MD  Melatonin 1 MG TABS Take 1 mg by mouth at bedtime.   Yes Historical Provider, MD  omeprazole (PRILOSEC) 20 MG capsule Take 20 mg by mouth daily.   Yes Historical Provider, MD  acetaminophen (TYLENOL) 500 MG tablet Take 500 mg by mouth every 6 (six) hours as needed for pain.    Historical Provider, MD    Allergies:   Allergies  Allergen Reactions  . Penicillins Anaphylaxis  . Tape Other (See Comments)    Tears skin off   . Iohexol Hives and Rash    Social History:  reports that she has never smoked. She does not have any smokeless tobacco history on file. She reports that she does not drink alcohol or use illicit drugs.  Family History: unobtainable  Physical Exam: Filed Vitals:   11/01/13 2158 11/01/13 2204 11/01/13 2304  BP: 83/50  94/43  Pulse: 102  98  Temp:  97.8 F (36.6 C)   TempSrc:  Oral   Resp: 20  20  Height: 5\' 4"  (1.626 m)    Weight: 59.421 kg (131 lb)    SpO2: 94%  97%   General appearance: alert, cooperative and no distress Head: Normocephalic, without obvious abnormality, atraumatic Eyes: negative Nose: Nares normal. Septum midline. Mucosa normal. No drainage or sinus tenderness. Neck: no JVD and supple, symmetrical, trachea midline Lungs: clear to auscultation bilaterally Heart: regular rate and rhythm, S1, S2 normal, no murmur, click, rub or gallop Abdomen: soft, non-tender; bowel sounds normal; no masses,  no organomegaly Extremities: extremities normal, atraumatic, no cyanosis or edema Pulses: 2+ and symmetric Skin: Skin color, texture, turgor normal. No rashes or lesions Neurologic: Grossly  normal    Labs on Admission:   Recent Labs  11/01/13 2308  NA 134*  K 3.7  CL 93*  CO2 26  GLUCOSE 96  BUN 47*  CREATININE 1.70*  CALCIUM 8.5    Recent Labs  11/01/13 2308  AST 15  ALT 8  ALKPHOS 44  BILITOT 0.3  PROT 6.2  ALBUMIN 2.7*    Recent Labs  11/01/13 2308  LIPASE 23    Recent Labs  11/01/13 2308  WBC 8.3  NEUTROABS 5.4  HGB 12.9  HCT 38.8  MCV 91.1  PLT 337   Radiological Exams on Admission: Dg Chest 2 View  11/01/2013   CLINICAL DATA:  Cough.  Congestion.  Weakness.  Fever.  EXAM: CHEST  2 VIEW  COMPARISON:  DG CHEST 2 VIEW dated 10/06/2012; DG CHEST 2 VIEW dated 08/20/2012; DG CHEST 1V PORT dated 08/18/2012; CT CHEST W/O CM dated 08/18/2012  FINDINGS: New opacity is present projected over the cardiopericardial silhouette on the lateral view and on the frontal view, this localizes the inferior right upper lobe. Findings are compatible with pneumonia. This is superimposed on chronic changes of the chest. Lower lung volumes are present.  Followup to ensure radiographic clearing and exclude an underlying lesion is recommended. Typically clearing will be observed at 8 weeks.  Thoracic kyphosis is present. Surgical clips are present in the upper abdomen and in the thoracic inlet. Pulmonary nodule seen on prior chest CT (08/18/2012) are present however difficult to visualize on the background of chronic lung disease.  IMPRESSION: New right upper lobe airspace disease most consistent with pneumonia. Background chronic interstitial and airspace opacity is likely associated with atypical infection/ mycobacterium avium complex.   Electronically Signed   By: Dereck Ligas M.D.   On: 11/01/2013 23:47    Assessment/Plan  80 yo female pleasantly demented with CAP with recent levaquin use and new prolonged qtc interval on ekg  Principal Problem:   PNA (pneumonia)-  Appears to be clinically responding.  Change abx to doxycycline, stop levaquin due to ekg changes.     Active Problems:   Hypertrophic obstructive cardiomyopathy(425.11)  stable   CKD (chronic kidney disease) stage 3, GFR 30-59 ml/min  stable   Prolonged QT interval  obs overnight on tele, will hold levaquin and repeat ekg in am   Cough  Stable  Presumptive full code, no paperwork from assisted living indicating otherwise.    Lakeyia Surber A 11/02/2013, 12:27 AM

## 2013-11-02 NOTE — Progress Notes (Signed)
Patient seen, independently examined and chart reviewed. I agree with exam, assessment and plan discussed with Dyanne Carrel, NP.  80 year old woman from assisted living being treated for pneumonia as an outpatient with Levaquin presented with shortness of breath. In the emergency department noted to be nontoxic however EKG showed significantly prolonged QTC and chest x-ray showed infiltrate, therefore patient was referred for admission.  PMH HOCM Chronic kidney disease stage III history of left subclavian stenosis status post  stenting. 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 Pancolonic diverticulosis.  She has some cough but denies pain. Breathing fine. Complains of generalized weakness.  Afebrile, vital signs stable. No hypoxia. She appears calm and comfortable. Mildly confused. Cardiovascular regular rate and rhythm. No murmur, rub or gallop. Respiratory clear to auscultation bilaterally. No wheezes, rales or rhonchi. Normal respiratory effort. No lower extremity edema.  Admission creatinine 1.7, improved today 1.4, baseline perhaps 1-1.4 CBC unremarkable. Lactic acid normal. Chest x-ray suggested right upper lobe airspace disease consistent with pneumonia. Consider chronic MAI. Initial EKG showed sinus tachycardia, right bundle branch block pattern incomplete, prolonged QTC. Today's EKG shows sinus rhythm, right bundle branch block old, resolution of prolonged QT.  QT prolongation has resolved. Likely secondary to Levaquin. Continues on doxycycline for pneumonia. Other comorbidities appear to be stable. If no changes would anticipate transfer back to assisted-living within 24 hours.  Murray Hodgkins, MD [facility] [phone]

## 2013-11-02 NOTE — Progress Notes (Signed)
ANTIBIOTIC CONSULT NOTE - INITIAL  Pharmacy Consult for Doxycycline Indication: pneumonia  Allergies  Allergen Reactions  . Penicillins Anaphylaxis  . Levaquin [Levofloxacin] Other (See Comments)    Prolonged QTc interval  . Tape Other (See Comments)    Tears skin off   . Iohexol Hives and Rash    Patient Measurements: Height: 5\' 8"  (172.7 cm) Weight: 117 lb 11.6 oz (53.4 kg) IBW/kg (Calculated) : 63.9  Vital Signs: Temp: 98.2 F (36.8 C) (02/09 0500) Temp src: Oral (02/09 0500) BP: 104/61 mmHg (02/09 0500) Pulse Rate: 77 (02/09 0500) Intake/Output from previous day:   Intake/Output from this shift:    Labs:  Recent Labs  11/01/13 2308 11/02/13 0535  WBC 8.3 6.3  HGB 12.9 11.2*  PLT 337 257  CREATININE 1.70* 1.40*   Estimated Creatinine Clearance: 26.1 ml/min (by C-G formula based on Cr of 1.4). No results found for this basename: VANCOTROUGH, VANCOPEAK, VANCORANDOM, GENTTROUGH, GENTPEAK, GENTRANDOM, TOBRATROUGH, TOBRAPEAK, TOBRARND, AMIKACINPEAK, AMIKACINTROU, AMIKACIN,  in the last 72 hours   Microbiology: No results found for this or any previous visit (from the past 720 hour(s)).  Medical History: Past Medical History  Diagnosis Date  . Hypertension   . Anemia 08/05/2012  . Hypertrophic obstructive cardiomyopathy(425.11) 09/09/2006    Qualifier: Diagnosis of  By: Truett Mainland MD, Christine    . Syncope and collapse 08/01/2012    Secondary to dehydration  . Cancer   . CKD (chronic kidney disease) stage 3, GFR 30-59 ml/min 08/01/2012  . Thyroid disease     Medications:  Scheduled:  . sodium chloride   Intravenous STAT  . divalproex  125 mg Oral TID  . doxycycline (VIBRAMYCIN) IV  100 mg Intravenous BID  . enoxaparin (LOVENOX) injection  30 mg Subcutaneous Q24H  . levothyroxine  88 mcg Oral QAC breakfast   Assessment: 80 yo F who resides in ALF admitted with shortness of breath, CXR + PNA.  She was started on Levaquin 10/29/13, which was stopped secondary  to prolonged QTc interval.  MD notes she had been clinically improving on this medication.   She has chronic renal insufficiency.   Her antibiotic was changed to Doxycycline on admission.  Doxycycline does not require renal dose adjustment.   Doxycycline 2/9>> Levaquin 2/5>>2/9  Goal of Therapy:  Eradicate infection.  Plan:  Continue doxycycline 100mg  po bid- consider change to oral therapy if patient continues to clinically improve. Duration of therapy per MD- recommend 7 days Pharmacy will sign off - please re-consult as needed  Doris Gruhn, Lav[place] Drafts 11/02/2013,8:50 AM

## 2013-11-03 DIAGNOSIS — J189 Pneumonia, unspecified organism: Secondary | ICD-10-CM | POA: Diagnosis not present

## 2013-11-03 LAB — BASIC METABOLIC PANEL
BUN: 20 mg/dL (ref 6–23)
CALCIUM: 8.5 mg/dL (ref 8.4–10.5)
CO2: 28 mEq/L (ref 19–32)
Chloride: 101 mEq/L (ref 96–112)
Creatinine, Ser: 1.05 mg/dL (ref 0.50–1.10)
GFR, EST AFRICAN AMERICAN: 56 mL/min — AB (ref >90)
GFR, EST NON AFRICAN AMERICAN: 48 mL/min — AB (ref >90)
GLUCOSE: 82 mg/dL (ref 70–99)
POTASSIUM: 3.9 meq/L (ref 3.7–5.3)
SODIUM: 138 meq/L (ref 137–147)

## 2013-11-03 LAB — CBC
HCT: 33.6 % — ABNORMAL LOW (ref 36.0–46.0)
Hemoglobin: 11.5 g/dL — ABNORMAL LOW (ref 12.0–15.0)
MCH: 31.3 pg (ref 26.0–34.0)
MCHC: 34.2 g/dL (ref 30.0–36.0)
MCV: 91.3 fL (ref 78.0–100.0)
PLATELETS: 260 10*3/uL (ref 150–400)
RBC: 3.68 MIL/uL — ABNORMAL LOW (ref 3.87–5.11)
RDW: 13.3 % (ref 11.5–15.5)
WBC: 5.8 10*3/uL (ref 4.0–10.5)

## 2013-11-03 LAB — TSH: TSH: 2.045 u[IU]/mL (ref 0.350–4.500)

## 2013-11-03 MED ORDER — DOXYCYCLINE HYCLATE 100 MG PO TABS: 100.0000 mg | ORAL_TABLET | Freq: Two times a day (BID) | ORAL | Status: AC

## 2013-11-03 MED ORDER — ENOXAPARIN SODIUM 40 MG/0.4ML ~~LOC~~ SOLN: 40.0000 mg | SUBCUTANEOUS | Status: DC

## 2013-11-03 NOTE — Discharge Summary (Signed)
Patient seen, independently examined and chart reviewed. I agree with exam, assessment and plan discussed with Dyanne Carrel, NP.  No issues overnight. She seems to be feeling well today and denies complaints.  Afebrile, vital signs are stable. Telemetry shows sinus rhythm. Cardiovascular regular rate and rhythm. Respiratory clear to auscultation bilaterally. No wheezes, rales or rhonchi. Normal respiratory effort. No lower extremity edema. Appears pleasantly confused.  Basic metabolic panel unremarkable. CBC stable.  She appears well. QTC prolongation secondary Levaquin has resolved. She completed a course of doxycycline for pneumonia. Dementia appears to be at baseline. She is stable to transfer back to assisted living today.  Murray Hodgkins, MD [facility] [phone]

## 2013-11-03 NOTE — Discharge Summary (Signed)
Physician Discharge Summary  Carolyn Hughes [id number] DOB: Feb 22, 1929 DOA: 11/01/2013  PCP: Carolyn Cahill, MD  Admit date: 11/01/2013 Discharge date: 11/03/2013  Time spent: 40 minutes  Recommendations for Outpatient Follow-up:  1. Follow up with PCP 1 week for evaluation of pneumonia. Recommend close BP monitoring as medications have been adjusted. Follow TSH 2. Daily monitoring of BP until follow up with PCP  Discharge Diagnoses:  Principal Problem:   PNA (pneumonia) Active Problems:   HYPERTENSION   Hypertrophic obstructive cardiomyopathy(425.11)   CKD (chronic kidney disease) stage 3, GFR 30-59 ml/min   Anemia   Prolonged QT interval   Cough   CAP (community acquired pneumonia)   Discharge Condition: stable  Diet recommendation: heart healthy  Filed Weights   11/01/13 2158 11/02/13 0143  Weight: 59.421 kg (131 lb) 53.4 kg (117 lb 11.6 oz)    History of present illness:  80 yo female with dementia lives in assisted living on levaquin for 3 days for pna was complaining of sob so sent to ED on2/9/15 for further evaluation. By time of arrival to ED, she had no complaints and was normal. No sob, no cp. She did endorse that she had been coughing. She appeared comfortable, and provided little useful information. Her 12 ekg showed significant prolonged qtc which was new, and cxr showed small infiltrate. Pt also with multiple allergies listed to abx.   Hospital Course:  PNA (pneumonia)- chest xray showed infiltrate. Had been on levaquin 2/5-2/9 for OP treatment. Changed to doxy on admission due to prolonged QT. She remained afebrile, no leukocytosis, not hypoxic. Oxygen saturation levels >90% on room air. Will be discharge with doxycycline to be taken through 11/06/13 to complete 5 day course. Recommend PCP follow up 1-2 weeks to ensure resolution. Active Problems:  Prolonged QT interval Likely related to previous levaquin use. Levaquin discontinued. Initial EKG with sinus  tachycardia, right bundle branch block pattern incomplete, prolonged QTC.  Repeat ekg on 11/02/13 showed sinus rhythm, right bundle branch block old, resolution of prolonged QT. No CP or sob.   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 Stable during this hospitalization.  Urine output stable.   HTN: somewhat soft during this hospitalization. Home medications of HCTZ and ARB held. Will discontinue at discharge until PCP follow up for evaluation of BP. Discharge instructions to facility include daily BP monitoring until PCP appointmen.   Hypothyroidism: TSH pending at discharge. Continue home synthroid  Anemia: mild. Appears at baseline. No s/sx of bleeding.   Cough: Improved at discharge. Related to #1.  continue robitussin   Procedures:  none  Consultations:  none  Discharge Exam: Filed Vitals:   11/03/13 0435  BP: 107/59  Pulse: 89  Temp: 98.3 F (36.8 C)  Resp: 20    General: well nourished pleasantly confused, appears comfortable Cardiovascular: RRR No MGR  No LE edema.  Respiratory: normal effort BS are clear bilaterally to auscultation. No crackles no wheeze  Discharge Instructions     Medication List    STOP taking these medications       irbesartan-hydrochlorothiazide 150-12.5 MG per tablet  Commonly known as:  AVALIDE     levofloxacin 750 MG tablet  Commonly known as:  LEVAQUIN      TAKE these medications       acetaminophen 500 MG tablet  Commonly known as:  TYLENOL  Take 500 mg by mouth every 6 (six) hours as needed for pain.  CALCIUM 600 + D PO  Take 1 tablet by mouth daily.     divalproex 125 MG DR tablet  Commonly known as:  DEPAKOTE  Take 125 mg by mouth 3 (three) times daily.     doxycycline 100 MG tablet  Commonly known as:  VIBRA-TABS  Take 1 tablet (100 mg total) by mouth every 12 (twelve) hours.     guaifenesin 100 MG/5ML syrup  Commonly known as:  ROBITUSSIN   Take 200 mg by mouth every 6 (six) hours as needed for cough. Not to exceed 4 doses     levothyroxine 88 MCG tablet  Commonly known as:  SYNTHROID, LEVOTHROID  Take 88 mcg by mouth every morning.     loperamide 2 MG tablet  Commonly known as:  IMODIUM A-D  Take 2 mg by mouth as needed for diarrhea or loose stools. Not to exceed 8 doses in 24hrs.     LORazepam 0.5 MG tablet  Commonly known as:  ATIVAN  Take 0.25 mg by mouth every 4 (four) hours as needed for anxiety.     Melatonin 1 MG Tabs  Take 1 mg by mouth at bedtime.     omeprazole 20 MG capsule  Commonly known as:  PRILOSEC  Take 20 mg by mouth daily.       Allergies  Allergen Reactions  . Penicillins Anaphylaxis  . Levaquin [Levofloxacin] Other (See Comments)    Prolonged QTc interval  . Tape Other (See Comments)    Tears skin off   . Iohexol Hives and Rash       Follow-up Information   Follow up with Carolyn Cahill, MD. Schedule an appointment as soon as possible for a visit in 1 week. (follow up on BP control)    Specialty:  Internal Medicine   Contact information:    [place] [id number] [phone]        The results of significant diagnostics from this hospitalization (including imaging, microbiology, ancillary and laboratory) are listed below for reference.    Significant Diagnostic Studies: Dg Chest 2 View  11/01/2013   CLINICAL DATA:  Cough.  Congestion.  Weakness.  Fever.  EXAM: CHEST  2 VIEW  COMPARISON:  DG CHEST 2 VIEW dated 10/06/2012; DG CHEST 2 VIEW dated 08/20/2012; DG CHEST 1V PORT dated 08/18/2012; CT CHEST W/O CM dated 08/18/2012  FINDINGS: New opacity is present projected over the cardiopericardial silhouette on the lateral view and on the frontal view, this localizes the inferior right upper lobe. Findings are compatible with pneumonia. This is superimposed on chronic changes of the chest. Lower lung volumes are present.  Followup to ensure radiographic clearing and exclude an  underlying lesion is recommended. Typically clearing will be observed at 8 weeks.  Thoracic kyphosis is present. Surgical clips are present in the upper abdomen and in the thoracic inlet. Pulmonary nodule seen on prior chest CT (08/18/2012) are present however difficult to visualize on the background of chronic lung disease.  IMPRESSION: New right upper lobe airspace disease most consistent with pneumonia. Background chronic interstitial and airspace opacity is likely associated with atypical infection/ mycobacterium avium complex.   Electronically Signed   By: Dereck Ligas M.D.   On: 11/01/2013 23:47    Microbiology: Recent Results (from the past 240 hour(s))  CULTURE, BLOOD (ROUTINE X 2)     Status: None   Collection Time    11/02/13  5:30 AM      Result Value Range Status  Specimen Description Blood   Final   Special Requests NONE   Final   Culture NO GROWTH <24 HRS   Final   Report Status PENDING   Incomplete  CULTURE, BLOOD (ROUTINE X 2)     Status: None   Collection Time    11/02/13  5:35 AM      Result Value Range Status   Specimen Description Blood   Final   Special Requests NONE   Final   Culture NO GROWTH <24 HRS   Final   Report Status PENDING   Incomplete  MRSA PCR SCREENING     Status: None   Collection Time    11/02/13 10:40 AM      Result Value Range Status   MRSA by PCR NEGATIVE  NEGATIVE Final   Comment:            The GeneXpert MRSA Assay (FDA     approved for NASAL specimens     only), is one component of a     comprehensive MRSA colonization     surveillance program. It is not     intended to diagnose MRSA     infection nor to guide or     monitor treatment for     MRSA infections.     Labs: Basic Metabolic Panel:  Recent Labs Lab 11/01/13 2308 11/02/13 0535 11/03/13 0602  NA 134*  --  138  K 3.7  --  3.9  CL 93*  --  101  CO2 26  --  28  GLUCOSE 96  --  82  BUN 47*  --  20  CREATININE 1.70* 1.40* 1.05  CALCIUM 8.5  --  8.5   Liver  Function Tests:  Recent Labs Lab 11/01/13 2308  AST 15  ALT 8  ALKPHOS 44  BILITOT 0.3  PROT 6.2  ALBUMIN 2.7*    Recent Labs Lab 11/01/13 2308  LIPASE 23   No results found for this basename: AMMONIA,  in the last 168 hours CBC:  Recent Labs Lab 11/01/13 2308 11/02/13 0535 11/03/13 0602  WBC 8.3 6.3 5.8  NEUTROABS 5.4  --   --   HGB 12.9 11.2* 11.5*  HCT 38.8 33.7* 33.6*  MCV 91.1 91.6 91.3  PLT 337 257 260   Cardiac Enzymes: No results found for this basename: CKTOTAL, CKMB, CKMBINDEX, TROPONINI,  in the last 168 hours BNP: BNP (last 3 results)  Recent Labs  11/01/13 2308  PROBNP 2993.0*   CBG: No results found for this basename: GLUCAP,  in the last 168 hours     Signed:  Radene Gunning  [facility] 11/03/2013, 10:02 AM

## 2013-11-03 NOTE — Progress Notes (Signed)
Patient discharged to [facility].  Facility aware and packet ready per SW.  Unable to have AVS signed and reviewed d/t patient having severe dementia.  EMS used to transfer.  At time of discharge patient was stable and in NAD.

## 2013-11-03 NOTE — Clinical Social Work Note (Signed)
Pt d/c today back to [facility]. Pt's step-daughter Levada Dy and facility aware and agreeable. [facility] feels pt may not be safe to transport in facility [place] and requesting EMS. Angela agreeable. Pt to transfer via [facility]. D/C summary and FL2 faxed.  Benay Pike, [place]

## 2013-11-07 LAB — CULTURE, BLOOD (ROUTINE X 2)
Culture: NO GROWTH
Culture: NO GROWTH

## 2013-11-27 ENCOUNTER — Emergency Department (HOSPITAL_COMMUNITY)
Admission: EM | Admit: 2013-11-27 | Discharge: 2013-11-27 | Disposition: A | Discharge status: Home / Self Care | Payer: Medicare Other | Attending: Emergency Medicine | Admitting: Emergency Medicine

## 2013-11-27 ENCOUNTER — Emergency Department (HOSPITAL_COMMUNITY): Payer: Medicare Other

## 2013-11-27 ENCOUNTER — Encounter (HOSPITAL_COMMUNITY): Payer: Self-pay | Admitting: Emergency Medicine

## 2013-11-27 DIAGNOSIS — Z859 Personal history of malignant neoplasm, unspecified: Secondary | ICD-10-CM | POA: Diagnosis not present

## 2013-11-27 DIAGNOSIS — W19XXXA Unspecified fall, initial encounter: Secondary | ICD-10-CM | POA: Insufficient documentation

## 2013-11-27 DIAGNOSIS — F0390 Unspecified dementia without behavioral disturbance: Secondary | ICD-10-CM

## 2013-11-27 DIAGNOSIS — Z880 Allergy status to penicillin: Secondary | ICD-10-CM | POA: Insufficient documentation

## 2013-11-27 DIAGNOSIS — Z8639 Personal history of other endocrine, nutritional and metabolic disease: Secondary | ICD-10-CM | POA: Insufficient documentation

## 2013-11-27 DIAGNOSIS — I129 Hypertensive chronic kidney disease with stage 1 through stage 4 chronic kidney disease, or unspecified chronic kidney disease: Secondary | ICD-10-CM | POA: Insufficient documentation

## 2013-11-27 DIAGNOSIS — N183 Chronic kidney disease, stage 3 unspecified: Secondary | ICD-10-CM | POA: Diagnosis not present

## 2013-11-27 DIAGNOSIS — Z862 Personal history of diseases of the blood and blood-forming organs and certain disorders involving the immune mechanism: Secondary | ICD-10-CM | POA: Diagnosis not present

## 2013-11-27 DIAGNOSIS — Y939 Activity, unspecified: Secondary | ICD-10-CM | POA: Insufficient documentation

## 2013-11-27 DIAGNOSIS — Z043 Encounter for examination and observation following other accident: Secondary | ICD-10-CM | POA: Diagnosis present

## 2013-11-27 DIAGNOSIS — Y9229 Other specified public building as the place of occurrence of the external cause: Secondary | ICD-10-CM | POA: Insufficient documentation

## 2013-11-27 DIAGNOSIS — Z79899 Other long term (current) drug therapy: Secondary | ICD-10-CM | POA: Diagnosis not present

## 2013-11-27 LAB — URINALYSIS, ROUTINE W REFLEX MICROSCOPIC
Glucose, UA: NEGATIVE mg/dL
KETONES UR: 15 mg/dL — AB
Leukocytes, UA: NEGATIVE
NITRITE: NEGATIVE
PROTEIN: 30 mg/dL — AB
Specific Gravity, Urine: >1.03 — ABNORMAL HIGH (ref 1.005–1.030)
Urobilinogen, UA: 0.2 mg/dL (ref 0.0–1.0)
pH: 5.5 (ref 5.0–8.0)

## 2013-11-27 LAB — URINE MICROSCOPIC-ADD ON

## 2013-11-27 NOTE — ED Notes (Signed)
Removed from lsb and c-collar via log roll with edp at bedside.  Pt reports "hurting all over."  Nad noted.

## 2013-11-27 NOTE — ED Notes (Signed)
Pt given meal tray to take back to [facility].

## 2013-11-27 NOTE — ED Notes (Signed)
Pt to be transported back to [facility] by [employer]  - called and stated it would be a while but will send a truck when available.

## 2013-11-27 NOTE — ED Provider Notes (Signed)
CSN: [id number]     Arrival date & time 11/27/13  1139 History   First MD Initiated Contact with Patient 11/27/13 1145   This chart was scribed for Nat Christen, MD by Terressa Koyanagi, ED Scribe. This patient was seen in room APA04/APA04 and the patient's care was started at 11:48 AM.    Chief Complaint  Patient presents with  . Fall   Level 5 Caveat secondary to dementia  The history is provided by the EMS personnel. No language interpreter was used.   HPI Comments: Carolyn Hughes is a 80 y.o. female, with history of CA, HTN and syncope and collapse brought in by ambulance, who presents to the Emergency Department complaining of a fall. EMS reports that pt is a resident at [facility] and was found on the floor of her bedroom on her right side. She has no complaints of pain.   Past Medical History  Diagnosis Date  . Hypertension   . Anemia 08/05/2012  . Hypertrophic obstructive cardiomyopathy(425.11) 09/09/2006    Qualifier: Diagnosis of  By: Truett Mainland MD, Christine    . Syncope and collapse 08/01/2012    Secondary to dehydration  . Cancer   . CKD (chronic kidney disease) stage 3, GFR 30-59 ml/min 08/01/2012  . Thyroid disease    Past Surgical History  Procedure Laterality Date  . Abdominal hysterectomy    . Kidney surgery    . Surgery for thyroid cancer    . Nephrectomy Right     cancer   No family history on file. History  Substance Use Topics  . Smoking status: Never Smoker   . Smokeless tobacco: Not on file  . Alcohol Use: No   OB History   Grav Para Term Preterm Abortions TAB SAB Ect Mult Living                 Review of Systems  Unable to perform ROS: Dementia   Allergies  Penicillins; Levaquin; Tape; and Iohexol  Home Medications   Current Outpatient Rx  Name  Route  Sig  Dispense  Refill  . acetaminophen (TYLENOL) 500 MG tablet   Oral   Take 500 mg by mouth every 6 (six) hours as needed for moderate pain, fever or headache.          . Calcium  Carb-Cholecalciferol (CALCIUM 600 + D PO)   Oral   Take 1 tablet by mouth daily.         . divalproex (DEPAKOTE) 125 MG DR tablet   Oral   Take 125 mg by mouth 3 (three) times daily.         Marland Kitchen guaifenesin (ROBITUSSIN) 100 MG/5ML syrup   Oral   Take 200 mg by mouth every 6 (six) hours as needed for cough. Not to exceed 4 doses         . levofloxacin (LEVAQUIN) 750 MG tablet   Oral   Take 750 mg by mouth daily. For 5 days. Started on 11/25/2013         . loperamide (IMODIUM A-D) 2 MG tablet   Oral   Take 2 mg by mouth as needed for diarrhea or loose stools. Not to exceed 8 doses in 24hrs.         Marland Kitchen LORazepam (ATIVAN) 0.5 MG tablet   Oral   Take 0.25 mg by mouth every 4 (four) hours as needed for anxiety.         . magnesium hydroxide (MILK OF MAGNESIA) 400 MG/5ML  suspension   Oral   Take 30 mLs by mouth at bedtime as needed for mild constipation.         . Melatonin 1 MG TABS   Oral   Take 1 mg by mouth at bedtime.         Marland Kitchen omeprazole (PRILOSEC) 20 MG capsule   Oral   Take 20 mg by mouth daily.         . phenylephrine-shark liver oil-mineral oil-petrolatum (PREPARATION H) 0.25-3-14-71.9 % rectal ointment   Rectal   Place 1 application rectally as needed for hemorrhoids.          Triage Vitals: BP 115/55  Pulse 97  Resp 18  SpO2 95% Physical Exam  Nursing note and vitals reviewed. Constitutional: She appears well-developed and well-nourished.  Demented    HENT:  Head: Normocephalic and atraumatic.  Eyes:  Unable   Neck: Normal range of motion. Neck supple.  Cardiovascular: Normal rate, regular rhythm and normal heart sounds.   Pulmonary/Chest: Effort normal and breath sounds normal.  Abdominal: Soft. Bowel sounds are normal.  Musculoskeletal:  Unable  Neurological:  Unable  Skin: Skin is warm and dry.  Psychiatric:  Unable     ED Course  Procedures (including critical care time)  DIAGNOSTIC STUDIES: Oxygen Saturation is 95% on RA,  adequate by my interpretation.    COORDINATION OF CARE:  11:55 PM-Discussed treatment plan which includes  UA, CT head with pt at bedside and pt agreed to plan.   Results for orders placed during the hospital encounter of 11/27/13  URINALYSIS, ROUTINE W REFLEX MICROSCOPIC      Result Value Ref Range   Color, Urine YELLOW  YELLOW   APPearance CLEAR  CLEAR   Specific Gravity, Urine >1.030 (*) 1.005 - 1.030   pH 5.5  5.0 - 8.0   Glucose, UA NEGATIVE  NEGATIVE mg/dL   Hgb urine dipstick TRACE (*) NEGATIVE   Bilirubin Urine SMALL (*) NEGATIVE   Ketones, ur 15 (*) NEGATIVE mg/dL   Protein, ur 30 (*) NEGATIVE mg/dL   Urobilinogen, UA 0.2  0.0 - 1.0 mg/dL   Nitrite NEGATIVE  NEGATIVE   Leukocytes, UA NEGATIVE  NEGATIVE  URINE MICROSCOPIC-ADD ON      Result Value Ref Range   Squamous Epithelial / LPF RARE  RARE   WBC, UA 0-2  <3 WBC/hpf   RBC / HPF 0-2  <3 RBC/hpf   Bacteria, UA RARE  RARE   Results for orders placed during the hospital encounter of 11/27/13  URINALYSIS, ROUTINE W REFLEX MICROSCOPIC      Result Value Ref Range   Color, Urine YELLOW  YELLOW   APPearance CLEAR  CLEAR   Specific Gravity, Urine >1.030 (*) 1.005 - 1.030   pH 5.5  5.0 - 8.0   Glucose, UA NEGATIVE  NEGATIVE mg/dL   Hgb urine dipstick TRACE (*) NEGATIVE   Bilirubin Urine SMALL (*) NEGATIVE   Ketones, ur 15 (*) NEGATIVE mg/dL   Protein, ur 30 (*) NEGATIVE mg/dL   Urobilinogen, UA 0.2  0.0 - 1.0 mg/dL   Nitrite NEGATIVE  NEGATIVE   Leukocytes, UA NEGATIVE  NEGATIVE  URINE MICROSCOPIC-ADD ON      Result Value Ref Range   Squamous Epithelial / LPF RARE  RARE   WBC, UA 0-2  <3 WBC/hpf   RBC / HPF 0-2  <3 RBC/hpf   Bacteria, UA RARE  RARE   Dg Chest 2 View  11/01/2013   CLINICAL  DATA:  Cough.  Congestion.  Weakness.  Fever.  EXAM: CHEST  2 VIEW  COMPARISON:  DG CHEST 2 VIEW dated 10/06/2012; DG CHEST 2 VIEW dated 08/20/2012; DG CHEST 1V PORT dated 08/18/2012; CT CHEST W/O CM dated 08/18/2012  FINDINGS: New  opacity is present projected over the cardiopericardial silhouette on the lateral view and on the frontal view, this localizes the inferior right upper lobe. Findings are compatible with pneumonia. This is superimposed on chronic changes of the chest. Lower lung volumes are present.  Followup to ensure radiographic clearing and exclude an underlying lesion is recommended. Typically clearing will be observed at 8 weeks.  Thoracic kyphosis is present. Surgical clips are present in the upper abdomen and in the thoracic inlet. Pulmonary nodule seen on prior chest CT (08/18/2012) are present however difficult to visualize on the background of chronic lung disease.  IMPRESSION: New right upper lobe airspace disease most consistent with pneumonia. Background chronic interstitial and airspace opacity is likely associated with atypical infection/ mycobacterium avium complex.   Electronically Signed   By: Dereck Ligas M.D.   On: 11/01/2013 23:47   Ct Head Wo Contrast  11/27/2013   CLINICAL DATA:  Fall.  EXAM: CT HEAD WITHOUT CONTRAST  TECHNIQUE: Contiguous axial images were obtained from the base of the skull through the vertex without intravenous contrast.  COMPARISON:  August 01, 2012.  FINDINGS: Bony calvarium appears intact. Diffuse cortical atrophy is noted. Chronic ischemic white matter disease is noted. Ventricular dilatation is noted which is consistent with the degree of atrophy present. No mass effect or midline shift is noted. Ventricular size is within normal limits. There is no evidence of mass lesion, hemorrhage or acute infarction.  IMPRESSION: Diffuse cortical atrophy. Chronic ischemic white matter disease. No acute intracranial abnormality seen.   Electronically Signed   By: Sabino Dick M.D.   On: 11/27/2013 13:37    EKG Interpretation None      MDM   Final diagnoses:  Fall  Dementia   S/p fall today.   No head or neck trauma. No extremity pain or bony tenderness. CT head shows no acute  changes. Urinalysis negative   I personally performed the services described in this documentation, which was scribed in my presence. The recorded information has been reviewed and is accurate.    Nat Christen, MD 11/27/13 1535

## 2013-11-27 NOTE — Discharge Instructions (Signed)
CT scan of head and urinalysis were normal. Followup with primary care Dr.

## 2013-11-27 NOTE — ED Notes (Signed)
Per  EMS  - pt a resident at [facility].  Was found on floor on right side.  EMS reports, pt was on floor upon their arrival to [facility].  Pt arrived on LSB/c-collar.  Abrasions to right elbow and  Left knee.  Pt alert.  Denies pain.

## 2013-12-23 DEATH — deceased

## 2014-07-09 ENCOUNTER — Other Ambulatory Visit: Payer: Self-pay

## 2015-03-21 ENCOUNTER — Other Ambulatory Visit: Payer: Self-pay
